# Patient Record
Sex: Female | Born: 1950 | Race: White | Hispanic: No | Marital: Married | Smoking: Never smoker
Health system: Southern US, Community
[De-identification: ages and names within clinical notes are randomized; demographics above are authoritative.]

## PROBLEM LIST (undated history)

## (undated) DIAGNOSIS — I10 Essential (primary) hypertension: Secondary | ICD-10-CM

## (undated) DIAGNOSIS — G43909 Migraine, unspecified, not intractable, without status migrainosus: Secondary | ICD-10-CM

## (undated) HISTORY — PX: HERNIA REPAIR: SHX51

## (undated) HISTORY — PX: OTHER SURGICAL HISTORY: SHX169

## (undated) HISTORY — PX: TOTAL ABDOMINAL HYSTERECTOMY: SHX209

---

## 1998-06-16 ENCOUNTER — Ambulatory Visit (HOSPITAL_BASED_OUTPATIENT_CLINIC_OR_DEPARTMENT_OTHER): Admission: RE | Admit: 1998-06-16 | Discharge: 1998-06-16 | Payer: Self-pay | Admitting: Orthopedic Surgery

## 2000-03-20 ENCOUNTER — Encounter: Payer: Self-pay | Admitting: Orthopedic Surgery

## 2001-08-25 ENCOUNTER — Encounter: Payer: Self-pay | Admitting: Orthopedic Surgery

## 2001-08-25 ENCOUNTER — Ambulatory Visit (HOSPITAL_COMMUNITY): Admission: RE | Admit: 2001-08-25 | Discharge: 2001-08-25 | Payer: Self-pay | Admitting: Orthopedic Surgery

## 2005-06-23 ENCOUNTER — Other Ambulatory Visit: Admission: RE | Admit: 2005-06-23 | Discharge: 2005-06-23 | Payer: Self-pay | Admitting: Dermatology

## 2006-09-28 ENCOUNTER — Ambulatory Visit: Payer: Self-pay | Admitting: Orthopedic Surgery

## 2006-11-15 ENCOUNTER — Ambulatory Visit: Payer: Self-pay | Admitting: Orthopedic Surgery

## 2006-12-18 ENCOUNTER — Ambulatory Visit: Payer: Self-pay | Admitting: Orthopedic Surgery

## 2007-04-12 ENCOUNTER — Ambulatory Visit: Payer: Self-pay | Admitting: Orthopedic Surgery

## 2007-04-16 ENCOUNTER — Ambulatory Visit: Payer: Self-pay | Admitting: Orthopedic Surgery

## 2007-04-18 ENCOUNTER — Encounter: Payer: Self-pay | Admitting: Orthopedic Surgery

## 2007-10-04 ENCOUNTER — Ambulatory Visit: Payer: Self-pay | Admitting: Orthopedic Surgery

## 2007-10-04 DIAGNOSIS — M25569 Pain in unspecified knee: Secondary | ICD-10-CM

## 2007-10-04 DIAGNOSIS — M1710 Unilateral primary osteoarthritis, unspecified knee: Secondary | ICD-10-CM

## 2007-11-15 HISTORY — PX: OTHER SURGICAL HISTORY: SHX169

## 2007-12-13 ENCOUNTER — Ambulatory Visit: Payer: Self-pay | Admitting: Orthopedic Surgery

## 2008-01-17 ENCOUNTER — Ambulatory Visit: Payer: Self-pay | Admitting: Orthopedic Surgery

## 2008-02-05 ENCOUNTER — Ambulatory Visit: Payer: Self-pay | Admitting: Orthopedic Surgery

## 2008-02-05 ENCOUNTER — Encounter: Payer: Self-pay | Admitting: Orthopedic Surgery

## 2008-02-05 ENCOUNTER — Inpatient Hospital Stay (HOSPITAL_COMMUNITY): Admission: RE | Admit: 2008-02-05 | Discharge: 2008-02-08 | Payer: Self-pay | Admitting: Orthopedic Surgery

## 2008-02-08 ENCOUNTER — Encounter: Payer: Self-pay | Admitting: Orthopedic Surgery

## 2008-02-12 ENCOUNTER — Encounter: Payer: Self-pay | Admitting: Orthopedic Surgery

## 2008-02-14 ENCOUNTER — Ambulatory Visit: Payer: Self-pay | Admitting: Orthopedic Surgery

## 2008-02-14 DIAGNOSIS — Z96659 Presence of unspecified artificial knee joint: Secondary | ICD-10-CM | POA: Insufficient documentation

## 2008-02-19 ENCOUNTER — Encounter (INDEPENDENT_AMBULATORY_CARE_PROVIDER_SITE_OTHER): Payer: Self-pay | Admitting: Family Medicine

## 2008-02-20 ENCOUNTER — Telehealth: Payer: Self-pay | Admitting: Orthopedic Surgery

## 2008-02-25 ENCOUNTER — Encounter: Payer: Self-pay | Admitting: Orthopedic Surgery

## 2008-02-26 ENCOUNTER — Encounter: Payer: Self-pay | Admitting: Orthopedic Surgery

## 2008-03-04 ENCOUNTER — Telehealth: Payer: Self-pay | Admitting: Orthopedic Surgery

## 2008-03-14 ENCOUNTER — Encounter: Payer: Self-pay | Admitting: Orthopedic Surgery

## 2008-03-17 ENCOUNTER — Ambulatory Visit: Payer: Self-pay | Admitting: Orthopedic Surgery

## 2008-03-24 ENCOUNTER — Encounter: Payer: Self-pay | Admitting: Orthopedic Surgery

## 2008-03-27 ENCOUNTER — Encounter: Payer: Self-pay | Admitting: Orthopedic Surgery

## 2008-04-04 ENCOUNTER — Encounter: Payer: Self-pay | Admitting: Orthopedic Surgery

## 2008-04-10 ENCOUNTER — Telehealth: Payer: Self-pay | Admitting: Orthopedic Surgery

## 2008-04-21 ENCOUNTER — Ambulatory Visit: Payer: Self-pay | Admitting: Orthopedic Surgery

## 2008-07-17 ENCOUNTER — Telehealth: Payer: Self-pay | Admitting: Orthopedic Surgery

## 2008-07-23 ENCOUNTER — Ambulatory Visit: Payer: Self-pay | Admitting: Orthopedic Surgery

## 2008-11-19 ENCOUNTER — Ambulatory Visit: Payer: Self-pay | Admitting: Orthopedic Surgery

## 2008-11-20 ENCOUNTER — Telehealth: Payer: Self-pay | Admitting: Orthopedic Surgery

## 2009-02-18 ENCOUNTER — Ambulatory Visit (HOSPITAL_COMMUNITY): Admission: RE | Admit: 2009-02-18 | Discharge: 2009-02-18 | Payer: Self-pay | Admitting: Orthopedic Surgery

## 2009-02-18 ENCOUNTER — Ambulatory Visit: Payer: Self-pay | Admitting: Orthopedic Surgery

## 2010-02-22 ENCOUNTER — Ambulatory Visit: Payer: Self-pay | Admitting: Orthopedic Surgery

## 2010-07-06 ENCOUNTER — Encounter: Payer: Self-pay | Admitting: Orthopedic Surgery

## 2010-07-07 ENCOUNTER — Ambulatory Visit: Payer: Self-pay | Admitting: Orthopedic Surgery

## 2010-09-08 ENCOUNTER — Ambulatory Visit: Payer: Self-pay | Admitting: Orthopedic Surgery

## 2010-11-23 ENCOUNTER — Ambulatory Visit
Admission: RE | Admit: 2010-11-23 | Discharge: 2010-11-23 | Payer: Self-pay | Source: Home / Self Care | Attending: Orthopedic Surgery | Admitting: Orthopedic Surgery

## 2010-11-23 ENCOUNTER — Encounter: Payer: Self-pay | Admitting: Orthopedic Surgery

## 2010-11-24 ENCOUNTER — Encounter (INDEPENDENT_AMBULATORY_CARE_PROVIDER_SITE_OTHER): Payer: Self-pay | Admitting: *Deleted

## 2010-11-25 ENCOUNTER — Encounter: Payer: Self-pay | Admitting: Orthopedic Surgery

## 2010-12-09 ENCOUNTER — Telehealth: Payer: Self-pay | Admitting: Orthopedic Surgery

## 2010-12-16 NOTE — Letter (Signed)
Summary: surgery order planned surg date 01/03/11  surgery order planned surg date 01/03/11   Imported By: Cammie Sickle 11/26/2010 17:13:21  _____________________________________________________________________  External Attachment:    Type:   Image     Comment:   External Document

## 2010-12-16 NOTE — Assessment & Plan Note (Signed)
Summary: 2 M RE-CK KNEE/RESP TO MED/W.COMP/CAF   Visit Type:  Follow-up  CC:  left knee pain.  History of Present Illness: I saw [name] in the office today for a 2 month followup visit.  She is a 60 years old woman with the complaint of: continued LEFT knee pain status post microfracture many many years ago which involved medial femoral condyle.  She did well up until the last year or so  I gave her an injection last time and helped maybe a little bit she still complains of medial knee pain swelling and a pulling sensation behind the knee.  At this point she is considering surgical treatment with knee replacement but at this time she would like to wait a little bit longer.  Medications potassium, Dyazide, anticholesterol medicine.    Allergies: No Known Drug Allergies   Impression & Recommendations:  Problem # 1:  KNEE, ARTHRITIS, DEGEN./OSTEO (ICD-715.96) Assessment Unchanged  Orders: Est. Patient Level II (16109)  Patient Instructions: 1)  she is going to see [place] sometime at the beginning of next year 2012 to discuss this further.  There are workers compensation issues with this surgery and need to be addressed   Orders Added: 1)  Est. Patient Level II [60454]

## 2010-12-16 NOTE — Progress Notes (Signed)
Summary: Progress note  Progress note   Imported By: Jacklynn Ganong 07/23/2010 10:16:38  _____________________________________________________________________  External Attachment:    Type:   Image     Comment:   External Document

## 2010-12-16 NOTE — Progress Notes (Addendum)
Summary: government may not allow her surg / refer to clin update 11/24/10  Phone Note Call from Patient   Summary of Call: [name] called today to say that she received a letter from the government saying they will not allow her to have the knee surgery.  Will bring a copy of the letter for our files Initial call taken by: Jacklynn Ganong,  December 09, 2010 2:58 PM

## 2010-12-16 NOTE — Letter (Signed)
Summary: Auth Request Fax Workers comp for in-pt surgery  Auth Request Fax Workers comp for in-pt surgery   Imported By: Cammie Sickle 11/26/2010 17:10:07  _____________________________________________________________________  External Attachment:    Type:   Image     Comment:   External Document

## 2010-12-16 NOTE — Assessment & Plan Note (Signed)
Summary: XR RT TKA/BCBS/BSF   Visit Type:  Follow-up  CC:  right knee replacement.  History of Present Illness: I saw [name] in the office today for a followup visit.  She is a 60 years old woman with the complaint of:  right knee  Xrays today.  Right knee replacement 02/05/08.  2 years post op   The  alignment was normal, PTF reduced without tilt or subluxation, no evidence of loosening.  IMPRESSION: normal appearance of the implant   DEPUY FIXED BEARING SIGMA   Normal appearance of the right knee; No swelling; 115 ROM Stable AP and Coronal EXT power is normal  Gait is normal       Allergies: No Known Drug Allergies   Impression & Recommendations:  Problem # 1:  TOTAL KNEE FOLLOW-UP (ICD-V43.65) Assessment Improved  Orders: Est. Patient Level III (19147) Knee x-ray,  3 views (82956)  Patient Instructions: 1)  Please schedule a follow-up appointment in 1 year. 2)  TKA XRAYS RIGHT KNEE

## 2010-12-16 NOTE — Letter (Signed)
Summary: Dr Romeo Apple physician report 03/20/00  Dr Romeo Apple physician report 03/20/00   Imported By: Cammie Sickle 11/26/2010 17:09:11  _____________________________________________________________________  External Attachment:    Type:   Image     Comment:   External Document

## 2010-12-16 NOTE — Assessment & Plan Note (Signed)
Summary: left knee pain/needs xrays/work comp.cbt   Visit Type:  Follow-up  CC:  left knee pain.  History of Present Illness: a 60 year old female status post RIGHT knee replacement several years ago presents withhistory of LEFT knee pain.  She had arthroscopy LEFT knee microfracture with drilling about 6 years ago comes in now complaining of posterolateral leg pain as well as pain over the medial compartment of the LEFT knee.  Pain seemed to come on insidiously is described as dull and deep with moderate severity and is intermittent it appears to be related to weightbearing activities.  There appears to be no swelling.  No catching no locking.  ALLERGIES no drug ALLERGIES  Medications potassium, Dyazide, anticholesterol medicine.  Review of systems as stated she has some posterolateral leg pain, denies back pain or radiculopathy.  Exam general exam is normal psych exam is normal neuro exam is normal vascular exam is normal  Musculoskeletal findings included a normal gait pattern for total knee replacement no limp on the LEFT side.  She does have medial joint line tenderness.  Range of motion is zero-120  Motor exam in the quads 5 over 5 strength.  The knee is stable.  She does have a positive straight leg raise with pain behind the leg.  X-rays were obtained 3 views of the knee shows that she has medial and patellofemoral joint space compromise described as moderate.  Impression osteoarthritis 719.46 plan injection, medication and hematoma 5 mg b.i.d. follow up in 2 monthsis  Allergies: No Known Drug Allergies  Past History:  Past Medical History: Last updated: 11/19/2008 Fluid Decreased potassium cholesterol  Past Surgical History: Last updated: 11/19/2008 Knee Arthroscopy both knees Total Abdominal Hysterectomy Hernia rt knee total arthroplasty, 2009  Physical Exam  Extremities:  RIGHT knee midline incision total knee skin normal.  No swelling.  Range of motion  125.  Motor exam 5 over 5.  Knee stable.   Impression & Recommendations:  Problem # 1:  KNEE, ARTHRITIS, DEGEN./OSTEO (ICD-715.96) Assessment New  intra-articular injection LEFT knee Verbal consent was obtained. The knee was prepped with alcohol and ethyl chloride. 1 cc of depomedrol 40mg /cc and 4 cc of lidocaine 1% was injected. there were no complications.  Orders: Est. Patient Level IV (16109) Knee x-ray,  3 views (60454) Joint Aspirate / Injection, Large (20610) Depo- Medrol 40mg  (J1030)  Patient Instructions: 1)  Please schedule a follow-up appointment in 2 months.

## 2010-12-16 NOTE — Assessment & Plan Note (Signed)
Summary: RE-EVAL LT KNEE/?NEW XRAY/W.COMP/CAF   Visit Type:  Follow-up  CC:  recheck left knee.  History of Present Illness: I saw [name] in the office today for a followup visit.  She is a 60 years old woman with the complaint of: continued LEFT knee pain status post microfracture many many years ago which involved medial femoral condyle.  Today is recheck on the left knee.  Xrays taken 07/07/10 left knee.  Medications: potassium, Dyazide, anticholesterol medicine, no pain meds.  as noted above, but she has come to the end of the road. Regarding her LEFT knee as it is causing her significant problems in terms her activities of daily living, as well as this severe pain that she is experiencing. Her pain will reach the level of an 8/10 but she is able to continue on, however, her activities of daily living have become progressively more difficult.  She has had a RIGHT knee replacement and understands risk and benefits of knee replacement surgery.  We completed our informed consent process today.  We scheduled her for surgery pending worker's compensation approval. She will also need to include physical therapy after surgery at home, as well as at the hospital.  Please see separate history and physical dictation      Allergies (verified): No Known Drug Allergies   Impression & Recommendations:  Problem # 1:  KNEE, ARTHRITIS, DEGEN./OSTEO (ICD-715.96) Assessment Deteriorated the most recent film. It took shows that she has varus medial and patellofemoral arthritis. Orders: Est. Patient Level IV (46962)  Patient Instructions: 1)  DOS 01/03/11 2)  I will call you and let you know when to go for your preop at [facility], take packet with you. 3)  Post op 1 in our office on 01/17/11   Orders Added: 1)  Est. Patient Level IV [95284]

## 2010-12-16 NOTE — Medication Information (Signed)
Summary: Tax adviser   Imported By: Cammie Sickle 07/12/2010 10:43:54  _____________________________________________________________________  External Attachment:    Type:   Image     Comment:   External Document

## 2010-12-20 ENCOUNTER — Encounter: Payer: Self-pay | Admitting: Orthopedic Surgery

## 2010-12-21 ENCOUNTER — Encounter: Payer: Self-pay | Admitting: Orthopedic Surgery

## 2010-12-22 ENCOUNTER — Encounter (INDEPENDENT_AMBULATORY_CARE_PROVIDER_SITE_OTHER): Payer: Self-pay | Admitting: *Deleted

## 2010-12-28 ENCOUNTER — Telehealth: Payer: Self-pay | Admitting: Orthopedic Surgery

## 2010-12-28 ENCOUNTER — Telehealth (INDEPENDENT_AMBULATORY_CARE_PROVIDER_SITE_OTHER): Payer: Self-pay | Admitting: *Deleted

## 2010-12-29 ENCOUNTER — Other Ambulatory Visit (HOSPITAL_COMMUNITY): Payer: Self-pay

## 2010-12-30 ENCOUNTER — Telehealth: Payer: Self-pay | Admitting: Orthopedic Surgery

## 2010-12-30 NOTE — Letter (Signed)
Summary: Re-Fax Auth request W Comp OWCP  Re-Fax Auth request W Comp OWCP   Imported By: Cammie Sickle 12/20/2010 20:04:53  _____________________________________________________________________  External Attachment:    Type:   Image     Comment:   External Document

## 2010-12-30 NOTE — Miscellaneous (Signed)
Summary: faxed notes to med mod and [employer]iva for surgery   Clinical Lists Changes

## 2010-12-30 NOTE — Letter (Signed)
Summary: Workers Biomedical scientist of fax rec'd  Workers comp acknowledgement of fax rec'd   Imported By: Cammie Sickle 12/21/2010 12:15:16  _____________________________________________________________________  External Attachment:    Type:   Image     Comment:   External Document

## 2011-01-03 ENCOUNTER — Ambulatory Visit (HOSPITAL_COMMUNITY)
Admission: RE | Admit: 2011-01-03 | Payer: Worker's Compensation | Source: Ambulatory Visit | Admitting: Orthopedic Surgery

## 2011-01-05 NOTE — Progress Notes (Addendum)
Summary: Patient cancelling surgery until further notice from [facility]  Phone Note Call from Patient   Caller: Patient Summary of Call: Patient called back, due to no receipt of authorization from [facility] comp, she is cancelling her total knee surgery for Mon, 01/03/11, as she wishes to give the courtesy of advanced notice to [facility] [employer]. and to all other related contacts for the surgery.  She will re-schedule as soon as possible, upon Workers Comp's approval. *   Patient asking if Dr Romeo Apple will consider ordering an Xray at either [facility] or [facility] since it is her understanding from [facility] claims examiner that this is what Workers Comp requests.  The notes and report of previous Xray, performed in our office in August of 2011, had been sent and is under review. Dr Romeo Apple please advise. Initial call taken by: Cammie Sickle,  December 30, 2010 1:46 PM  Follow-up for Phone Call        ok have reneee or chasity do this friday  Follow-up by: Fuller Canada MD,  December 30, 2010 4:16 PM  Additional Follow-up for Phone Call Additional follow up Details #1::        cxed surgery with kim and called Depuy rep [facility] Additional Follow-up by: Ether Griffins,  December 30, 2010 4:32 PM

## 2011-01-05 NOTE — Progress Notes (Signed)
Summary: Pt states Workers Comp needs Lawyer Note Call from Patient   Caller: Patient Summary of Call: Patient called back today, following my fol/up call to her and to Workers Comp carrier re: status of authorization request for total knee surgery on 01/03/11.  Patient states that claims examiner requests Xray report from 07/07/10 office visit, signed by radiologist.  The Xray was done here and the appropriate office notes were sent to Workers comp, electronically signed by Dr Romeo Apple.  Dr Romeo Apple please advise. Initial call taken by: Cammie Sickle,  December 28, 2010 10:03 AM  Follow-up for Phone Call        Informed him that there are no radiology notes or readings of private office. X-rays Follow-up by: Fuller Canada MD,  December 28, 2010 10:09 AM  Additional Follow-up for Phone Call Additional follow up Details #1::        I have advised patient and have called [employer] Workers Comp at Ph # [phone] in response to patient's conversation with claims examiner,Katherine Pollie Meyer.  Lft voice mail message.  Notified patient of status. Additional Follow-up by: Cammie Sickle,  December 28, 2010 3:47 PM

## 2011-01-05 NOTE — Progress Notes (Signed)
Summary: Please cancel pre-op appt of 12/29/10,W.Comp still pending  Phone Note Call from Patient   Caller: Patient Summary of Call: Patient called back and per previous notes, does not yet have approval from Workers Comp for her total knee surgery. Asking to cancel her pre-op appt at this time, and if no further word over the next 2 to 3 days, she will have [place] cancel the surgery date also, pending Worker's comp's further review.   *Please note: she gave her Cell ph # [phone] / home ph (same as above, although going out of town to her mother's tomorrow, 12/29/10) Initial call taken by: Cammie Sickle,  December 28, 2010 2:48 PM  Follow-up for Phone Call        ok canceled preop Follow-up by: Ether Griffins,  December 28, 2010 2:59 PM  Additional Follow-up for Phone Call Additional follow up Details #1::        advised patient. Additional Follow-up by: Cammie Sickle,  December 28, 2010 3:51 PM

## 2011-01-06 ENCOUNTER — Ambulatory Visit (HOSPITAL_COMMUNITY)
Admission: RE | Admit: 2011-01-06 | Discharge: 2011-01-06 | Disposition: A | Discharge status: Home / Self Care | Source: Ambulatory Visit | Attending: Orthopedic Surgery | Admitting: Orthopedic Surgery

## 2011-01-06 ENCOUNTER — Encounter: Payer: Self-pay | Admitting: Orthopedic Surgery

## 2011-01-06 ENCOUNTER — Other Ambulatory Visit: Payer: Self-pay | Admitting: Orthopedic Surgery

## 2011-01-06 ENCOUNTER — Telehealth (INDEPENDENT_AMBULATORY_CARE_PROVIDER_SITE_OTHER): Payer: Self-pay | Admitting: *Deleted

## 2011-01-06 DIAGNOSIS — M25569 Pain in unspecified knee: Secondary | ICD-10-CM | POA: Insufficient documentation

## 2011-01-11 NOTE — Progress Notes (Signed)
Summary: new Xray order per Workers Comp request  Phone Note Outgoing Call   Call placed to: Patient Summary of Call: Call to patient in response to Workers Comp request for new Xray LT knee w/radiologist reading. Order for [facility] being faxed today; patient notified and will go today for the Xray.  - After Dr Romeo Apple receives report, I will fax to Workers Comp. Initial call taken by: Cammie Sickle,  January 06, 2011 9:24 AM  Follow-up for Phone Call        ok Follow-up by: Ether Griffins,  January 06, 2011 9:37 AM

## 2011-01-17 ENCOUNTER — Ambulatory Visit: Payer: Self-pay | Admitting: Orthopedic Surgery

## 2011-01-20 NOTE — Letter (Signed)
Summary: Xray order LT knee [facility]  Xray order LT knee [facility]   Imported By: Cammie Sickle 01/11/2011 19:08:01  _____________________________________________________________________  External Attachment:    Type:   Image     Comment:   External Document

## 2011-01-25 ENCOUNTER — Encounter: Payer: Self-pay | Admitting: Orthopedic Surgery
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
## 2011-01-26 ENCOUNTER — Encounter (INDEPENDENT_AMBULATORY_CARE_PROVIDER_SITE_OTHER): Payer: Self-pay | Admitting: *Deleted

## 2011-01-27 ENCOUNTER — Ambulatory Visit (INDEPENDENT_AMBULATORY_CARE_PROVIDER_SITE_OTHER): Payer: Worker's Compensation | Admitting: Orthopedic Surgery

## 2011-01-27 ENCOUNTER — Encounter: Payer: Self-pay | Admitting: Orthopedic Surgery

## 2011-01-27 DIAGNOSIS — M1710 Unilateral primary osteoarthritis, unspecified knee: Secondary | ICD-10-CM

## 2011-02-01 ENCOUNTER — Encounter: Payer: Self-pay | Admitting: Orthopedic Surgery

## 2011-02-01 NOTE — Letter (Signed)
Summary: Approval OWCP surgery prior authorization   Approval OWCP surgery prior authorization   Imported By: Cammie Sickle 01/27/2011 20:06:11  _____________________________________________________________________  External Attachment:    Type:   Image     Comment:   External Document

## 2011-02-01 NOTE — Miscellaneous (Signed)
Summary: Surgery authorized per [facility] Workers Comp 01/25/11  Clinical Lists Changes  On 01/25/11 - Ph [phone] - Fol/up call to insurer, following patient receiving call that total knee surgery is approved.   Per [employer], [place] [address] LT total knee arthroplasty has been approved. AUTH # [id number].  Spoke w/Monique, ref# [id number]. 01/25/11 - Confirmation letter received by fax; scanned into chart.  01/26/11 - Called patient to notify.  Patient is coming 01/27/11 for appointment re: scheduling of surgery.

## 2011-02-01 NOTE — Assessment & Plan Note (Signed)
Summary: SCHEDULE SURGERY/XRAY [facility]/W.Comp insurance approval   Visit Type:  Follow-up  CC:  f knee replacement surgery.  History of Present Illness:   60 year old female with end-stage osteoarthritis LEFT knee.  She failed conservative management.  She complains of severe pain.  We'll like to have the surgery today if she could.  Her pain is primarily medial, she has trouble with activities of daily living.  She describes her pain as constant waxing and waning in its intensity, she describes as severe, sharp and dull throbbing worse with exercise and not improving.  Allergies: No Known Drug Allergies  Past History:  Past Medical History: Last updated: 11/19/2008 Fluid Decreased potassium cholesterol  Past Surgical History: Last updated: 11/19/2008 Knee Arthroscopy both knees Total Abdominal Hysterectomy Hernia rt knee total arthroplasty, 2009  Family History: Reviewed history and no changes required. no history of any serious medical illnesses  Social History: Patient is married.  denies smoking or drinking  Review of Systems Musculoskeletal:  See HPI.  The review of systems is negative for Constitutional, Cardiovascular, Respiratory, Gastrointestinal, Genitourinary, Neurologic, Endocrine, Psychiatric, Skin, HEENT, Immunology, and Hemoatologic.  Physical Exam  Additional Exam:  vital signs have been stable  She is well-developed and well-nourished grooming and hygiene are normal there are no gross deformities  The cardiovascular system reveals no swelling or varicose veins, pulses and temperature are normal, there is no edema or tenderness  Skin is warm dry and intact with no lesions  Neuro exam shows normal sensation coordination reflux mood and affect are normal  Lymphatic systems benign nodes in the cervical spine  Musculoskeletal she has normal strength range of motion stability and alignment in the upper extremities   Her RIGHT total knee is  functioning well with 115 of flexion and full extension  The knee remained stable there is no tenderness swelling or alignment issues  The LEFT knee is tender over the medial joint line shows a flexion arc of 125 normally today only 100 grade 5 motor strength.  No skin issues  X-rays show medial compartment joint space narrowing consistent with osteoarthritis and mild varus deformity  Plan LEFT total knee arthroplasty  Informed consent has been done in the office    Impression & Recommendations:  Problem # 1:  KNEE, ARTHRITIS, DEGEN./OSTEO ([id number]-638.75)  Orders: Est. Patient Level III (64332) LEFT TKA    Orders Added: 1)  Est. Patient Level III [95188]

## 2011-02-01 NOTE — Miscellaneous (Signed)
  preop for left tka Clinical Lists Changes

## 2011-02-10 NOTE — Letter (Signed)
Summary: Auth request Physical therapy [employer] Work Comp  Auth request Physical therapy [employer] Work Comp   Imported By: Cammie Sickle 02/04/2011 09:46:42  _____________________________________________________________________  External Attachment:    Type:   Image     Comment:   External Document

## 2011-02-10 NOTE — Letter (Signed)
Summary: Fax response physical therapy auth request W.Comp [employer]CP  Fax response physical therapy auth request W.Comp [employer]CP   Imported By: Cammie Sickle 02/04/2011 09:10:41  _____________________________________________________________________  External Attachment:    Type:   Image     Comment:   External Document

## 2011-02-24 ENCOUNTER — Telehealth: Payer: Self-pay | Admitting: *Deleted

## 2011-02-24 NOTE — Telephone Encounter (Signed)
I faxed information to [facility] and [employer] for assistance after total knee replacement

## 2011-03-03 ENCOUNTER — Other Ambulatory Visit: Payer: Self-pay | Admitting: Orthopedic Surgery

## 2011-03-03 ENCOUNTER — Encounter (HOSPITAL_COMMUNITY)
Admission: RE | Admit: 2011-03-03 | Discharge: 2011-03-03 | Source: Ambulatory Visit | Attending: Orthopedic Surgery | Admitting: Orthopedic Surgery

## 2011-03-03 ENCOUNTER — Telehealth: Payer: Self-pay | Admitting: Orthopedic Surgery

## 2011-03-03 LAB — PROTIME-INR
INR: 0.9 (ref 0.00–1.49)
Prothrombin Time: 12.4 seconds (ref 11.6–15.2)

## 2011-03-03 LAB — DIFFERENTIAL
Basophils Absolute: 0 10*3/uL (ref 0.0–0.1)
Basophils Relative: 0 % (ref 0–1)
Eosinophils Relative: 2 % (ref 0–5)
Lymphocytes Relative: 22 % (ref 12–46)
Monocytes Absolute: 0.4 10*3/uL (ref 0.1–1.0)
Neutro Abs: 4.9 10*3/uL (ref 1.7–7.7)

## 2011-03-03 LAB — CBC
MCHC: 34.5 g/dL (ref 30.0–36.0)
Platelets: 228 10*3/uL (ref 150–400)
RDW: 12.8 % (ref 11.5–15.5)
WBC: 7 10*3/uL (ref 4.0–10.5)

## 2011-03-03 LAB — BASIC METABOLIC PANEL
BUN: 11 mg/dL (ref 6–23)
Chloride: 101 mEq/L (ref 96–112)
Creatinine, Ser: 0.71 mg/dL (ref 0.4–1.2)
GFR calc Af Amer: >60 mL/min (ref >60)
GFR calc non Af Amer: >60 mL/min (ref >60)
Potassium: 3.9 mEq/L (ref 3.5–5.1)

## 2011-03-03 LAB — SURGICAL PCR SCREEN: MRSA, PCR: NEGATIVE

## 2011-03-03 NOTE — Telephone Encounter (Signed)
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  Any questions, Gina's # is [phone]

## 2011-03-06 ENCOUNTER — Encounter: Payer: Self-pay | Admitting: Orthopedic Surgery

## 2011-03-06 NOTE — H&P (Signed)
Summary: [name]/W.Comp insurance approval   March 05, 2011   Visit Type: Follow-up  CC: f knee replacement surgery.  History of Present Illness:  60 year old female with end-stage osteoarthritis LEFT knee. She failed conservative management. She complains of severe pain. We'll like to have the surgery today if she could.  Her pain is primarily medial, she has trouble with activities of daily living.  She describes her pain as constant waxing and waning in its intensity, she describes as severe, sharp and dull throbbing worse with exercise and not improving.  Allergies:  No Known Drug Allergies  Past History:  Past Medical History:  Last updated: 11/19/2008  Fluid  Decreased potassium  cholesterol  Past Surgical History:  Last updated: 11/19/2008  Knee Arthroscopy both knees  Total Abdominal Hysterectomy  Hernia  rt knee total arthroplasty, 2009  Family History:  Reviewed history and no changes required.  no history of any serious medical illnesses  Social History:  Patient is married.  denies smoking or drinking  Review of Systems  Musculoskeletal: See HPI.  The review of systems is negative for Constitutional, Cardiovascular, Respiratory, Gastrointestinal, Genitourinary, Neurologic, Endocrine, Psychiatric, Skin, HEENT, Immunology, and Hemoatologic.  Physical Exam  Additional Exam: vital signs have been stable  She is well-developed and well-nourished grooming and hygiene are normal there are no gross deformities  The cardiovascular system reveals no swelling or varicose veins, pulses and temperature are normal, there is no edema or tenderness  Skin is warm dry and intact with no lesions  Neuro exam shows normal sensation coordination reflux mood and affect are normal  Lymphatic systems benign nodes in the cervical spine  Musculoskeletal she has normal strength range of motion stability and alignment in the upper extremities  Her RIGHT total knee is  functioning well with 115 of flexion and full extension  The knee remained stable there is no tenderness swelling or alignment issues  The LEFT knee is tender over the medial joint line shows a flexion arc of 125 normally today only 100 grade 5 motor strength. No skin issues  X-rays show medial compartment joint space narrowing consistent with osteoarthritis and mild varus deformity  Plan LEFT total knee arthroplasty  Informed consent has been done in the office  Impression & Recommendations:  Problem # 1: KNEE, ARTHRITIS, DEGEN./OSTEO (YHC-623.76)  Orders:  Est. Patient Level III (28315)  LEFT TKA  Orders Added:  1) Est. Patient Level III [17616]

## 2011-03-07 ENCOUNTER — Ambulatory Visit (HOSPITAL_COMMUNITY)

## 2011-03-07 ENCOUNTER — Inpatient Hospital Stay (HOSPITAL_COMMUNITY)
Admission: RE | Admit: 2011-03-07 | Discharge: 2011-03-09 | DRG: 470 | Disposition: A | Discharge status: Home Health | Source: Ambulatory Visit | Attending: Orthopedic Surgery | Admitting: Orthopedic Surgery

## 2011-03-07 DIAGNOSIS — M1710 Unilateral primary osteoarthritis, unspecified knee: Principal | ICD-10-CM | POA: Diagnosis present

## 2011-03-07 DIAGNOSIS — IMO0002 Reserved for concepts with insufficient information to code with codable children: Secondary | ICD-10-CM

## 2011-03-08 LAB — CBC
HCT: 32 % — ABNORMAL LOW (ref 36.0–46.0)
MCH: 29.4 pg (ref 26.0–34.0)
MCHC: 35 g/dL (ref 30.0–36.0)
MCV: 84 fL (ref 78.0–100.0)
Platelets: 175 10*3/uL (ref 150–400)
RDW: 12.5 % (ref 11.5–15.5)

## 2011-03-08 LAB — BASIC METABOLIC PANEL
BUN: 8 mg/dL (ref 6–23)
Calcium: 8.6 mg/dL (ref 8.4–10.5)
Creatinine, Ser: 0.78 mg/dL (ref 0.4–1.2)
GFR calc non Af Amer: >60 mL/min (ref >60)
Glucose, Bld: 118 mg/dL — ABNORMAL HIGH (ref 70–99)

## 2011-03-08 LAB — DIFFERENTIAL
Eosinophils Absolute: 0 10*3/uL (ref 0.0–0.7)
Eosinophils Relative: 1 % (ref 0–5)
Lymphocytes Relative: 13 % (ref 12–46)
Lymphs Abs: 1.1 10*3/uL (ref 0.7–4.0)
Monocytes Absolute: 0.7 10*3/uL (ref 0.1–1.0)

## 2011-03-08 NOTE — Op Note (Signed)
[name], [name]                   ACCOUNT NO.:  [id number]  MEDICAL RECORD NO.:  [id number]           PATIENT TYPE:  I  LOCATION:  A339                          FACILITY:  [facility]  PHYSICIAN:  Vickki Hearing, M.D.DATE OF BIRTH:  [birth year]  DATE OF PROCEDURE: DATE OF DISCHARGE:                              OPERATIVE REPORT   SURGEON:  Vickki Hearing, MD  A 60 year old female, end-stage osteoarthritis, left knee, failed conservative management, status post previous microfracture, presented for knee replacement on the left.  PREOPERATIVE DIAGNOSIS:  Osteoarthritis, left knee.  POSTOPERATIVE DIAGNOSIS:  Osteoarthritis, left knee.  PROCEDURE:  Left total knee arthroplasty.  IMPLANTS:  DePuy fixed bearing sigma, size 3 femur, size 2.5 tibia, size 10 polyethylene PS insert and the size 32 patella, assisted by Margaree Mackintosh and Lucrezia Europe.  OPERATIVE FINDINGS:  Severe arthritis of the medial compartment with grade 4 changes, grade 3 changes of the patella, mild varus deformity.  TOURNIQUET TIME:  1 hour and 20 minutes at 300 mmHg.  COMPLICATIONS:  None.  The patient went to PACU in good condition.  Spinal anesthetic.  The patient was identified in the preop holding area and the left knee was marked by the patient and surgeon and the chart was updated and the site was confirmed.  The patient was taken to the operating room for spinal anesthetic, after which, 1 gram of Ancef was administered.  The Foley catheter was then placed in sterile fashion and a tourniquet was placed on the proximal portion of the left thigh.  The leg was then prepped and draped using sterile technique and the time-out procedure was executed.  The limb was exsanguinated with a 6-inch Esmarch and placed in flexion and the tourniquet was elevated.  A midline incision was made. Subcutaneous tissue was divided and medial arthrotomy was performed and the patella was everted.  The ACL and PCL were  resected as well as the anterior horns of the lateral and medial meniscus.  A portion of the patella fat pad was also resected.  After elevation of the medial soft tissue sleeve, a drill was placed in the center of the femoral canal and the canal was decompressed with irrigation and suction.  The intramedullary guide was set for a 10-mm resection and the 5 degrees valgus cut.  This instrument was pinned to the distal femur and an oscillating saw was removed, we used to remove the distal femur.  A figure-of-eight distal femoral configuration was noted and the femoral sizer was placed.  The femur was sized to a size 3 and pinned in external rotation to match the epicondyles.  After these bone fragments were removed with a four-in-one cutting block, we turned our attention to the tibia.  The tibial resection was set for an anatomic slope and a neutral varus valgus cut.  We referenced the lateral higher side 8-mm resection.  Once the block was pinned in place, the resection was made and the tibial single was checked for larger resection lateral than medial.  Spacer blocks were then placed.  A 12.5 spacer block was too tight,  10.5 fit well and balanced the knee in extension.  The flexion gap also matched and we proceeded to make the box cut with a size 3 cutting guide.  We then did a trial reduction and ranged the knee until the tibial tray rotation set itself and we marked the tibia followed by punching.  The patella was then measured to a size 23 and resected down to a 13 and a 32-mm button was placed and the three peg holes were drilled.  A trial reduction was obtained and the patella tracked normally.  The knee was balanced in flexion and extension and the trial components were removed.  The knee was irrigated and the cement was mixed on the back table in standard fashion.  After application of the cement and prosthesis, the cement was allowed to cure.  Excess cement was  removed, repeat irrigation was performed.  A 10-mm insert was placed after trial with the 10-mm insert.  The knee was checked for range of motion, patella tracked normally.  The knee had good flexion and extension and the passive flexion test gave a 115 degrees of knee flexion.  The knee was closed with the knee in flexion with #1 Bralon with two interrupted sutures.  Deep to the closure, 30 mL of Marcaine with epinephrine was injected and then a second injection was done with the capsule closed.  The pain pump catheter was placed in the subcu area and then a layer of 0 Monocryl and 2-0 Monocryl were used to close the skin followed by reapproximation with staples.  A pain pump was activated.  The knee was wrapped from toes to groin over a sterile Mepilex dressing and a Cryocup was placed and activated.  Postoperative plan is for full weightbearing.  The CPM machine will be started tomorrow.  The patient will be on the foot pumps, Ecotrin b.i.d. and TED hose.     Vickki Hearing, M.D.     SEH/MEDQ  D:  03/07/2011  T:  03/07/2011  Job:  [id number]  Electronically Signed by Fuller Canada M.D. on 03/08/2011 08:36:56 AM

## 2011-03-09 LAB — DIFFERENTIAL
Basophils Absolute: 0 10*3/uL (ref 0.0–0.1)
Basophils Relative: 0 % (ref 0–1)
Eosinophils Absolute: 0.2 10*3/uL (ref 0.0–0.7)
Eosinophils Relative: 3 % (ref 0–5)
Lymphs Abs: 1.2 10*3/uL (ref 0.7–4.0)
Neutrophils Relative %: 75 % (ref 43–77)

## 2011-03-09 LAB — CBC
MCV: 87.5 fL (ref 78.0–100.0)
Platelets: 148 10*3/uL — ABNORMAL LOW (ref 150–400)
RDW: 12.8 % (ref 11.5–15.5)
WBC: 8.8 10*3/uL (ref 4.0–10.5)

## 2011-03-09 LAB — BASIC METABOLIC PANEL
BUN: 7 mg/dL (ref 6–23)
GFR calc Af Amer: >60 mL/min (ref >60)
GFR calc non Af Amer: >60 mL/min (ref >60)
Potassium: 3.7 mEq/L (ref 3.5–5.1)

## 2011-03-12 LAB — CROSSMATCH
ABO/RH(D): B POS
Antibody Screen: NEGATIVE
Unit division: 0

## 2011-03-14 ENCOUNTER — Ambulatory Visit (INDEPENDENT_AMBULATORY_CARE_PROVIDER_SITE_OTHER): Payer: Worker's Compensation | Admitting: Orthopedic Surgery

## 2011-03-14 ENCOUNTER — Encounter: Payer: Self-pay | Admitting: Orthopedic Surgery

## 2011-03-14 DIAGNOSIS — Z96659 Presence of unspecified artificial knee joint: Secondary | ICD-10-CM

## 2011-03-14 NOTE — Progress Notes (Signed)
Surgery April 16  This is postop visit #1 unscheduled.  The patient was doing well ambulating with a cane. She balanced on one leg as part of her therapy and felt a pop and then felt distal anterior knee pain. She can no longer emboli with a cane. She had to use a set of crutches.  Exam shows that she is angulating with crutches full weightbearing. She can make a full knee extension and can flex the knee independently. The collateral ligaments are stable. There is tenderness on the medial and lateral portions of the distal part of the incision. There is no increased bruising or swelling. There.  X-ray was obtained. Prosthesis is in good alignment with no evidence of fracture or loosening.  Impression unexplained pain distal anterior knee.  Plan continue physical therapy decrease in her overall activity level in followup next week for suture/staple removal

## 2011-03-15 NOTE — Group Therapy Note (Signed)
  [name], [name]                   ACCOUNT NO.:  [id number]  MEDICAL RECORD NO.:  [id number]           PATIENT TYPE:  I  LOCATION:  A339                          FACILITY:  [facility]  PHYSICIAN:  Vickki Hearing, M.D.DATE OF BIRTH:  03-03-1951  DATE OF PROCEDURE: DATE OF DISCHARGE:                                PROGRESS NOTE   Postop day 1 left total knee arthroplasty, doing well.  Vital signs are stable.  Hemoglobin is 11.2.  Pain level is zero.  Potassium is 3.4. The patient will start physical therapy today.     Vickki Hearing, M.D.     SEH/MEDQ  D:  03/08/2011  T:  03/08/2011  Job:  [id number]  Electronically Signed by Fuller Canada M.D. on 03/15/2011 09:55:31 AM

## 2011-03-15 NOTE — Discharge Summary (Signed)
  [name], [name]                   ACCOUNT NO.:  [id number]  MEDICAL RECORD NO.:  [id number]           PATIENT TYPE:  I  LOCATION:  A339                          FACILITY:  [facility]  PHYSICIAN:  Vickki Hearing, M.D.DATE OF BIRTH:  Sep 21, 1951  DATE OF ADMISSION:  03/07/2011 DATE OF DISCHARGE:  04/25/2012LH                              DISCHARGE SUMMARY   ADMITTING DIAGNOSIS:  Osteoarthritis, left knee.  DISCHARGE DIAGNOSIS:  Osteoarthritis, left knee.  PROCEDURE:  Left total knee arthroplasty.  IMPLANTS:  DePuy fixed bearing sigma size 3 femur, size 2.5 tibia, size 10 polyethylene PS insert, size 32 patella.  OPERATIVE FINDINGS:  Severe arthritis of the medial compartment with grade 3 changes of the patella with mild varus deformity.  There were no complications.  The patient tolerated the procedure well, really had no issues during the hospital course.  Progressed well on physical therapy and was discharged on March 09, 2011, postop day #2.  Her medications were as follows: 1. Simvastatin 40 mg. 2. Citalopram 20 mg. 3. Triamterene/hydrochlorothiazide 37.5/25 mg. 4. Butalbital and acetaminophen with caffeine p.r.n. 5. Potassium 10 mEq t.i.d. 6. Methocarbamol 500 mg q.6 h. P.r.n. 7. Senna/docusate sodium 8.6 mg one daily. 8. Aspirin 325 mg b.i.d. 9. Hydrocodone 5 - 325 mg q.4 h. p.r.n. for pain.  Follow up is scheduled in 2 weeks from surgery on Monday.  The patient is discharged with home health, a CPM machine.  She is to wear TED stockings for 1 month.  She is full weightbearing.  Her overall condition is stable and she is improved.  Her disposition is to home.     Vickki Hearing, M.D.     SEH/MEDQ  D:  03/14/2011  T:  03/15/2011  Job:  [id number]  Electronically Signed by [name] M.D. on 03/15/2011 09:55:28 AM

## 2011-03-21 ENCOUNTER — Ambulatory Visit (INDEPENDENT_AMBULATORY_CARE_PROVIDER_SITE_OTHER): Payer: Worker's Compensation | Admitting: Orthopedic Surgery

## 2011-03-21 ENCOUNTER — Ambulatory Visit: Payer: Self-pay | Admitting: Orthopedic Surgery

## 2011-03-21 DIAGNOSIS — Z96659 Presence of unspecified artificial knee joint: Secondary | ICD-10-CM

## 2011-03-21 MED ORDER — HYDROCODONE-ACETAMINOPHEN 7.5-325 MG PO TABS: 1.0000 | ORAL_TABLET | Freq: Four times a day (QID) | ORAL | Status: AC | PRN

## 2011-03-21 NOTE — Progress Notes (Signed)
Addended by: Fuller Canada on: 03/21/2011 05:26 PM   Modules accepted: Orders

## 2011-03-21 NOTE — Progress Notes (Signed)
April 16 date of surgery  Procedure LEFT total knee arthroplasty  Implants Sigma posterior stabilized total knee with [place]w  Last visit complained of a popping sensation and distal infrapatellar pain which has subsequently resolved with rest and decreased activity  Flexion is now passed 100.  Full extension.  Staples were removed in the office.  The wound looks clean dry and intact with no drainage or cellulitis.  Plan she is ready for outpatient therapy at [facility] Return to clinic in 4 weeks for a third postop visit routine Steri-Strips applied to the wound TED Hose can be removed irritating patient's skin

## 2011-03-24 ENCOUNTER — Telehealth: Payer: Self-pay | Admitting: Orthopedic Surgery

## 2011-03-24 NOTE — Telephone Encounter (Signed)
On 03/23/11 - Called Workers Comp, [place] [employer] "[employer]" ph [phone], requested ext [phone] for Hughie Closs, case manager re: status approval for out-patient physical therapy, for which request was originally faxed 02/01/11 with estimated # of visits, with [facility] noted preferred provider.    Left voice mail message. On 03/24/11, patient relates she has received a call back from Ms. Pollie Meyer, who states that system shows that the request was received. Approval is pending and may be finalized in the next 3 to 5 days.  Patient to call if she receives further response.

## 2011-03-25 ENCOUNTER — Other Ambulatory Visit: Payer: Self-pay | Admitting: Orthopedic Surgery

## 2011-03-25 NOTE — H&P (Signed)
[name], [name]                   ACCOUNT NO.:  [id number]  MEDICAL RECORD NO.:  [id number]         PATIENT TYPE:  PAMB  LOCATION:  DAY                           FACILITY:  [facility]  PHYSICIAN:  Vickki Hearing, M.D.DATE OF BIRTH:  08/18/1951  DATE OF ADMISSION:  01/03/2011 DATE OF DISCHARGE:  LH                             HISTORY & PHYSICAL   CHIEF COMPLAINT:  Left knee pain.  This is a 60 year old female who injured her left knee approximately 6 years ago and eventually had a left knee arthroscopy with microfracture. She did well until about a year ago when she started having increased pain and swelling again in the left knee.  The pain came on insidiously and is described as a dull, deep ache, moderate in severity, initially intermittent, but has become more frequent, and is associated with weightbearing activities.  She denies catching or locking.  We did try nonoperative treatment but it was not successful and she has come to the realization that she needs further treatment on her knee.  Her pain will reach an 8/10 at times and her activities of daily living have become more difficult.  We have scheduled her for surgery pending workers' Data processing manager.  She is scheduled for a left total knee arthroplasty and she understands the risks and benefits of the procedure saying that she had a right total knee replacement and did well several years back.  She has no known drug allergies.  She has a past history of high cholesterol.  She has had arthroscopic surgery of both knees, a right total knee, and total abdominal hysterectomy, a hernia.  Her right total knee was in 2009.  MEDICATIONS:  Listed are potassium and Dyazide, needs correlation with the patient.  REVIEW OF SYSTEMS:  Other than stated is negative.  SOCIAL HISTORY:  She is married.  She does not smoke or drink.  FAMILY HISTORY:  Noncontributory for any serious illnesses.  PHYSICAL EXAMINATION:   VITAL SIGNS:  Stable. GENERAL:  She is well developed and well nourished, grooming and hygiene are normal.  There are no deformities. CARDIOVASCULAR:  Reveals no swelling or varicose veins.  Pulses and temperature are normal.  There was no edema or tenderness. SKIN:  Warm, dry, and intact with no lesions. NEUROLOGIC:  Shows normal sensation, coordination and reflexes.  Mood and affect are normal.  Lymph nodes in the cervical spine are benign and normal. MUSCULOSKELETAL:  She has normal strength, range of motion, stability, and alignment of her upper extremities.  Her right total knee is functioning very well with 115 degrees of knee flexion, full extension. Her knee is stable.  There is no tenderness or swelling or alignment issues.  Left knee, she has tenderness over the medial joint line.  She was noted to have flexion arc of 125 degrees, grade 5 motor strength. Her left knee has no skin issues.  X-rays show medial compartment joint space narrowing, consistent with osteoarthritis and mild varus.  PLAN:  Left total knee arthroplasty.  Informed consent was done in the office.     Vickki Hearing, M.D.  SEH/MEDQ  D:  11/30/2010  T:  12/01/2010  Job:  [id number]  Electronically Signed by Fuller Canada M.D. on 03/25/2011 10:56:48 AM

## 2011-03-28 ENCOUNTER — Telehealth: Payer: Self-pay | Admitting: *Deleted

## 2011-03-28 NOTE — Telephone Encounter (Signed)
rec'd a fax from [facility] asking for a refill on her Norco. I had the pharmacist check who the md was. It is her ortho md. She will take Dr. Maurice March off this & send to original prescriber. I asked that she use E rx with [place]

## 2011-03-29 NOTE — Telephone Encounter (Signed)
As of 03/25/11, message received from Jamelle Haring of [employer],worker's comp - verified physical therapy at [facility] is approved: [id number].  Patient also received this information directly from Ms.Pollie Meyer, and scheduled for first out-patient therapy appointment in [place] facility 03/28/11.

## 2011-03-29 NOTE — H&P (Signed)
NAMEMELVIN, MARMO                   ACCOUNT NO.:  [id number]   MEDICAL RECORD NO.:  [id number]         PATIENT TYPE:  PAMB   LOCATION:  DAY                           FACILITY:  [facility]   PHYSICIAN:  Vickki Hearing, M.D.DATE OF BIRTH:  [birth year]   DATE OF ADMISSION:  DATE OF DISCHARGE:  LH                              HISTORY & PHYSICAL   CHIEF COMPLAINT:  Degenerative joint disease, right knee and right knee  pain.   HISTORY:  This is a 60 year old female with increasing severe  nonradiating aching right knee pain.  She was treated with anti-  inflammatories, Lortab, injections, rest, attempts at weight loss, and  she did not improve.  She denies mechanical pain symptoms, but has pain  and stiffness after sitting, and constant pain throughout the day.  Her  radiographs show osteoarthritis which has progressed on x-ray and she  wishes to have total knee replacement.   PAST HISTORY, FAMILY HISTORY AND SOCIAL HISTORY:  Revealed the  following:  She had a total abdominal hysterectomy, herniorrhaphy and  arthroscopy of both knees.   ALLERGIES:  She has no known drug allergies.   MEDICATIONS:  1. She takes potassium.  2. Dyazide.  3. Lortab 2.5.  4. Relafen 500.   REVIEW OF SYSTEMS:  There are no major findings under review of systems   SOCIAL HISTORY:  She is married.  Does not smoke or drink   PHYSICAL EXAMINATION:  CONSTITUTIONAL FINDINGS:  She is well-developed  and nourished, grooming and hygiene are normal.  There are no  deformities.  VITAL SIGNS:  Stable.  CARDIOVASCULAR:  There is no swelling or varicose veins.  Pulses and  temperature normal.  No edema or tenderness.  SKIN:  Warm, dry, intact, no lesions.  NEURO:  Normal sensation, coordination, and reflexes.  PSYCHIATRIC:  Awake, alert, oriented x3.  Mood and affect normal.  LYMPH/CERVICAL SPINE:  Benign.  MUSCULOSKELETAL:  Motor strength 5/5 quads, hamstrings, ankle  dorsiflexors, and plantar flexors.  Gait  shows a mild-to-moderate limb.  Range of motion in the right knee is 115 degrees with full passive  extension.  Ligaments are stable, mild varus deformity noted.  Upper  extremities show no evidence of contracture, subluxation, atrophy, or  tremor.   IMPRESSION:  Osteoarthritis right knee.  Code 715.96.   PLAN:  Right total knee replacement.  Informed consent process has been  completed in the office.  Risks and benefits of surgery were explained.  Alternative treatments were explained; which included continued  medication and injection, more attempts at weight loss, supportive brace  and cane use, the patient opted for surgery.  Specific to this  procedure, risks of deep vein thrombosis, pulmonary embolus, infection  requiring two-stage revision, stiffness and continued pain were  discussed, and accepted by the patient.      Vickki Hearing, M.D.  Electronically Signed     SEH/MEDQ  D:  02/04/2008  T:  02/04/2008  Job:  [id number]   cc:   [facility]  Fax: [phone]

## 2011-03-29 NOTE — Op Note (Signed)
NAMEANNISE, [name]                   ACCOUNT NO.:  [id number]   MEDICAL RECORD NO.:  [id number]          PATIENT TYPE:  AMB   LOCATION:  DAY                           FACILITY:  [facility]   PHYSICIAN:  Vickki Hearing, M.D.DATE OF BIRTH:  December 27, 1950   DATE OF PROCEDURE:  02/05/2008  DATE OF DISCHARGE:                               OPERATIVE REPORT   HISTORY:  This is a 60 year old female with increasing severe  nonradiating aching right knee pain.  She was treated with anti-  inflammatories, Lortab, injections, rest, attempts at weight loss and  did not improve.  Although she denied mechanical symptoms, she had  significant pain and functional disability with stiffness after sitting  and pain throughout the day.  Radiographs showed progressive arthritis,  and after discussing the options, we both agreed that she would be a  good candidate for total knee replacement.   PREOPERATIVE DIAGNOSIS:  Osteoarthritis of the right knee.   POSTOPERATIVE DIAGNOSIS:  Osteoarthritis of the right knee.   PROCEDURE:  Right total knee arthroplasty.   COMPONENTS USED:  We used a DePuy Sigma fixed bearing posterior  stabilized implant with a 3 femur, 3 tibia, 32 patella, 10-mm  polyethylene insert.  We also used a pain pump catheter in the subcu  layer.   ANESTHESIA:  We used a spinal anesthetic.   OPERATIVE FINDINGS:  There was severe wear on the medial and lateral  femoral condyles.  There was parapatellar spurring.  There was intact  medial and lateral menisci with ACL, PCL intact. The tibial plateau was  only noted to have grade 1 changes.   SPECIMENS:  Specimens were sent to pathology.   DESCRIPTION OF PROCEDURE:  The patient was identified as Presenter, broadcasting.  She  marked the right knee as the surgical site.  I countersigned her  marking.  Her history and physical was updated.  She was taken to the  operating room where she had a spinal anesthetic without any untoward  events.  She was given a  gram of Ancef and placed supine on the  operating table.   A Foley catheter was inserted with ease.  A tourniquet was applied to  her right thigh.  After prep with DuraPrep and sterile draping, a time-  out was taken and the procedure was confirmed on the correct patient.  Implants were noted to be present.  Radiographs were hanging and  corresponded with the pathology noted.   The tourniquet was elevated to 300 mmHg after exsanguination of the  right lower extremity with a 6-inch Esmarch.  The knee was placed in  flexion.  A straight midline incision was used to approach the knee.  The medial parapatellar incision was used, and the patella was everted.  The menisci, ACL and PCL were resected.  The notch was stenotic.  An  osteotome was used to open up the notch.  The patellofemoral ligament  was released.  The tibial guide was set for a neutral cut with 10 mm  taken from the medial, referencing the medial tibial plateau.  The tibia  measured a 3 for the baseplate at this point.   We then introduced a drill bit into the femoral canal, decompressed it  with suction and irrigation.  Set the femur for a 10-mm distal cut with  5-degree valgus alignment.  We then checked the extension gap with a  spacer block.  I was not able to get the spacer block in.  I felt that  the tibial cut was too small as well as the femoral cut and took 2 mm  from each side of the joint.  This allowed the 10-mm spacer block to be  inserted.   We then measured the femur to a size 3, set the femur in 3 degrees  external rotation and made the 4 cuts.  We checked the flexion gap at  this point and rechecked the extension gap, and the 10-mm insert spacer  block fit very well with good collateral ligament stability.   We then made the box cut, measured the patella to a 22-mm thickness, cut  it down to a 14.  We remeasured it and confirmed.  Made 3 peg holes and  then irrigated the joint.  We put the trials in place  and ranged the  knee several times and set the tibial rotation.  The patella tracked  slightly with tilt; therefore, a lateral release was performed.  After  marking the tibial rotation, we then punched and reamed the canal and  then irrigated the joint and prepared it for cementing.   Two bags of cement were used.  They were mixed on the back table, and  the implants were cemented in place.  Excess cement was removed.  Cement  was allowed to cure. A second check for excess cement was made, and any  fragments of cement were also removed at that time.  The 10-mm trial  insert was replaced with the real insert.  The  knee was taken through a  range of motion.  Range of motion was 0 to 125 degrees.  Collateral  ligament stability was normal.  AP stability was normal at 90 degrees.   Radiographs confirmed position of the implants to be excellent.   The wound was injected 30 mL of Marcaine with epinephrine.  The extensor  mechanism was closed with interrupted Bralon suture in combination of  running suture.  We  then closed the subcu with 2-0 and 0 Monocryl over  a pain pump catheter.  Staples were used to reapproximate the skin.  Sterile dressings were applied.  The tourniquet was released. Cryo/Cuff  was applied.   The patient was taken to the recovery room in stable condition to be  placed in CPM and start a routine postop total knee protocol.      Vickki Hearing, M.D.  Electronically Signed     SEH/MEDQ  D:  02/05/2008  T:  02/05/2008  Job:  [id number]

## 2011-04-01 NOTE — Discharge Summary (Signed)
NAMEJENNET, [name]                   ACCOUNT NO.:  [id number]   MEDICAL RECORD NO.:  [id number]          PATIENT TYPE:  INP   LOCATION:  A329                          FACILITY:  [facility]   PHYSICIAN:  Vickki Hearing, M.D.DATE OF BIRTH:  26-Jun-1951   DATE OF ADMISSION:  02/05/2008  DATE OF DISCHARGE:  03/27/2009LH                               DISCHARGE SUMMARY   ADMITTING DIAGNOSIS:  Osteoarthritis of right knee.   DISCHARGE DIAGNOSIS:  Osteoarthritis of right knee.   OPERATIVE PROCEDURE:  Right total knee arthroplasty.   IMPLANTS USED:  We used a PFC Sigma DePuy posterior stabilized total  knee implant with a size-3 modular tibial tray, cemented size-3 femoral  component with lugs, and oval dome patella size 32.  We used SmartSet  bone cement and used a 10-mm cross-link stabilized insert.   OPERATIVE FINDINGS:  Osteoarthritis was found primarily over the medial  femoral condyle.   COMPLICATIONS:  There were no complications.   ANESTHESIA:  The surgery was done under spinal anesthetic.   HOSPITAL COURSE:  The patient followed a standard total knee protocol  with a CPM machine, early weightbearing, mobilization, and Coumadin.  She has tolerated this well, had physical therapy, mobilized well with a  walking distance of 300 feet.  Passive range of motion was 0-75 on February 08, 2008, which was the day of discharge.  Hemoglobin was 10.6 and her  pro time was 21.5 with an INR of 1.8 on February 07, 2008 and then on February 04, 2008, and 1.9 on the February 08, 2008.   DISCHARGE MEDICATIONS AND INSTRUCTIONS:  She was discharged on:  1. Darvocet 1-2 q.4 h. p.r.n. for pain.  2. Coumadin 2.5 mg daily for 7 days.  She was to resume:  1. Midrin 1-2 daily as needed.  2. Simvastatin 40 mg daily.  3. Triamterene and hydrochlorothiazide 1-2 capsules daily.  4. Citalopram 20 mg daily.  5. Klor-Con 10 mEq 3 tablets daily.   DISCHARGE DISPOSITION:  Home.   CONDITION:  Improved.   She is  scheduled to return in 2 weeks postop at my office.      Vickki Hearing, M.D.  Electronically Signed     SEH/MEDQ  D:  02/28/2008  T:  02/28/2008  Job:  [id number]

## 2011-04-13 NOTE — Group Therapy Note (Signed)
  NAMEHENREITTA, SPITTLER                   ACCOUNT NO.:  [id number]  MEDICAL RECORD NO.:  [id number]           PATIENT TYPE:  LOCATION:                                 FACILITY:  PHYSICIAN:  Harrold Fitchett G. Renard Matter, MD   DATE OF BIRTH:  [birth year]  DATE OF PROCEDURE: DATE OF DISCHARGE:                                PROGRESS NOTE   This patient was admitted to the hospital following a fall at home.  She apparently did not lose consciousness, but complained of pain over the right ribcage.  X-rays were obtained which did not show evidence of fracture.  CT of the head, generalized atrophy, no acute intracranial abnormality noted.  OBJECTIVE:  VITAL SIGNS:  Blood pressure 162/64, respirations 16, pulse 78, and temperature 97.8. LUNGS:  Diminished breath sounds.  Tenderness over the right ribcage. HEART:  Regular rhythm. ABDOMEN:  No palpable organs or masses.  IMPRESSION:  The patient had a fall at home, has contusion of right ribcage and generalized soreness.  The patient is scheduled for carotid Doppler ultrasound today.  We will proceed with this.  Continue current regimen.     Ayleen Mckinstry G. Renard Matter, MD     AGM/MEDQ  D:  04/07/2011  T:  04/07/2011  Job:  [id number]  Electronically Signed by Butch Penny MD on 04/13/2011 06:22:58 AM

## 2011-04-19 ENCOUNTER — Other Ambulatory Visit: Payer: Self-pay | Admitting: Orthopedic Surgery

## 2011-04-20 ENCOUNTER — Ambulatory Visit (INDEPENDENT_AMBULATORY_CARE_PROVIDER_SITE_OTHER): Payer: Worker's Compensation | Admitting: Orthopedic Surgery

## 2011-04-20 DIAGNOSIS — Z96659 Presence of unspecified artificial knee joint: Secondary | ICD-10-CM

## 2011-04-20 DIAGNOSIS — R200 Anesthesia of skin: Secondary | ICD-10-CM

## 2011-04-20 DIAGNOSIS — R209 Unspecified disturbances of skin sensation: Secondary | ICD-10-CM

## 2011-04-20 MED ORDER — HYDROCODONE-ACETAMINOPHEN 5-325 MG PO TABS: 1.0000 | ORAL_TABLET | ORAL | Status: AC | PRN

## 2011-04-20 MED ORDER — GABAPENTIN 100 MG PO CAPS: 100.0000 mg | ORAL_CAPSULE | Freq: Every day | ORAL | Status: DC

## 2011-04-20 NOTE — Progress Notes (Signed)
6 week followup LEFT total knee replacement  Patient concerned that her knee is not bending as much as the opposite RIGHT total knee.  Therapy notes indicate flexion passive 120.  Active range motion 106.  Examination unremarkable  Impression status post LEFT total knee with 106 of flexion making adequate progress  Plan continue physical therapy return in 6 weeks

## 2011-06-01 ENCOUNTER — Ambulatory Visit (INDEPENDENT_AMBULATORY_CARE_PROVIDER_SITE_OTHER): Payer: Worker's Compensation | Admitting: Orthopedic Surgery

## 2011-06-01 DIAGNOSIS — Z96659 Presence of unspecified artificial knee joint: Secondary | ICD-10-CM | POA: Insufficient documentation

## 2011-06-01 NOTE — Progress Notes (Signed)
  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

## 2011-07-09 ENCOUNTER — Encounter: Payer: Self-pay | Admitting: Orthopedic Surgery

## 2011-07-09 ENCOUNTER — Telehealth: Payer: Self-pay | Admitting: Orthopedic Surgery

## 2011-07-09 NOTE — Progress Notes (Signed)
  Clarification note  There seems to be some discrepancy regarding the patient's preoperative diagnosis and coded diagnosis in the chart  The patient had osteoarthritis localized to the knee joint. This should read diagnostic code 715.16. In the preop certification process a different code was used indicating osteoarthritis of the lower extremity.  Both diagnoses codes are correct the osteoarthritis was in the lower extremity in the knee joint.  Hopefully this will clarify the situation.  Sincerely Dr. Romeo Apple.

## 2011-07-09 NOTE — Telephone Encounter (Signed)
Message copied by Fuller Canada MD E on Sat Jul 09, 2011  4:40 PM ------      Message from: FOLTZ, CAROL A      Created: Fri Jul 08, 2011  1:00 PM      Regarding: code question not resolved       Here is copy of email from [facility] hospital coder Alba Destine, regarding the coding issue for surgery (hospital billing) for [facility], total knee surgery:            if dr Romeo Apple would come to our dept and add an addendum to the record (write something on a progress note) telling us if the osteoarthritis is localized or not,  we could then scan that paper into the record and change the code to a 715.16            From: Warden Fillers       Sent: Thursday, July 07, 2011 3:03 PM      To: Alba Destine; Cammie Sickle      Cc: Vinnie Level      Subject: RE: [employer] hospital billing issue MRN [id number] [phone] #UPDATE#            Dennie Bible,      Can the other code of 715.16 code be added?      We have 715.96 coded on the acct but [facility]OL has 715.16 approved.             Thanks,      Olegario Messier

## 2011-08-08 LAB — DIFFERENTIAL
Basophils Absolute: 0
Basophils Absolute: 0
Basophils Absolute: 0
Basophils Relative: 0
Basophils Relative: 0
Basophils Relative: 0
Eosinophils Absolute: 0.2
Eosinophils Absolute: 0.3
Eosinophils Relative: 1
Eosinophils Relative: 3
Monocytes Absolute: 0.7
Monocytes Absolute: 0.8
Monocytes Relative: 7
Monocytes Relative: 8
Monocytes Relative: 8
Neutro Abs: 6.1
Neutro Abs: 7.4
Neutrophils Relative %: 75

## 2011-08-08 LAB — BASIC METABOLIC PANEL
BUN: 14
BUN: 8
CO2: 29
CO2: 29
CO2: 30
Calcium: 8.5
Calcium: 8.5
Chloride: 101
Chloride: 103
Chloride: 107
Chloride: 98
Creatinine, Ser: 0.71
GFR calc Af Amer: >60
Glucose, Bld: 107 — ABNORMAL HIGH
Glucose, Bld: 142 — ABNORMAL HIGH
Glucose, Bld: 94
Glucose, Bld: 97
Potassium: 3.4 — ABNORMAL LOW
Potassium: 3.8
Sodium: 135
Sodium: 135

## 2011-08-08 LAB — CBC
HCT: 31.2 — ABNORMAL LOW
Hemoglobin: 10.6 — ABNORMAL LOW
Hemoglobin: 11.2 — ABNORMAL LOW
MCHC: 35.1
MCHC: 35.4
MCHC: 35.8
MCV: 86
MCV: 86.5
MCV: 86.7
Platelets: 174
RBC: 3.48 — ABNORMAL LOW
RBC: 3.48 — ABNORMAL LOW
RBC: 3.64 — ABNORMAL LOW
RBC: 4.59
RDW: 12.9
RDW: 13.2
RDW: 13.4
WBC: 7

## 2011-08-08 LAB — CROSSMATCH
ABO/RH(D): B POS
Antibody Screen: NEGATIVE

## 2011-08-08 LAB — PROTIME-INR
INR: 0.9
INR: 1.9 — ABNORMAL HIGH
Prothrombin Time: 12.2

## 2011-08-08 LAB — ABO/RH: ABO/RH(D): B POS

## 2011-08-08 LAB — APTT: aPTT: 28

## 2011-09-06 ENCOUNTER — Ambulatory Visit: Payer: Self-pay | Admitting: Orthopedic Surgery

## 2011-09-07 ENCOUNTER — Ambulatory Visit: Payer: Self-pay | Admitting: Orthopedic Surgery

## 2011-09-13 ENCOUNTER — Ambulatory Visit (INDEPENDENT_AMBULATORY_CARE_PROVIDER_SITE_OTHER): Payer: Worker's Compensation | Admitting: Orthopedic Surgery

## 2011-09-13 ENCOUNTER — Encounter: Payer: Self-pay | Admitting: Orthopedic Surgery

## 2011-09-13 VITALS — Ht 63.5 in | Wt 190.0 lb

## 2011-09-13 DIAGNOSIS — Z96659 Presence of unspecified artificial knee joint: Secondary | ICD-10-CM

## 2011-09-13 NOTE — Patient Instructions (Signed)
Continue normal activity

## 2011-09-13 NOTE — Progress Notes (Signed)
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  She ambulated very well during her vacation walking 3-5 miles per day. She does complain of some weakness in the LEFT leg, which I think, is related to the neuropathy.  She is to continue Neurontin, normal activities. Follow up in 6 months for one year x-ray LEFT knee

## 2011-10-10 ENCOUNTER — Other Ambulatory Visit: Payer: Self-pay | Admitting: Orthopedic Surgery

## 2011-10-11 ENCOUNTER — Other Ambulatory Visit: Payer: Self-pay | Admitting: Orthopedic Surgery

## 2011-10-11 DIAGNOSIS — M792 Neuralgia and neuritis, unspecified: Secondary | ICD-10-CM

## 2011-10-11 MED ORDER — GABAPENTIN 100 MG PO CAPS: 100.0000 mg | ORAL_CAPSULE | Freq: Every day | ORAL | Status: DC

## 2012-03-13 ENCOUNTER — Ambulatory Visit (INDEPENDENT_AMBULATORY_CARE_PROVIDER_SITE_OTHER): Payer: BC Managed Care – PPO

## 2012-03-13 ENCOUNTER — Ambulatory Visit (INDEPENDENT_AMBULATORY_CARE_PROVIDER_SITE_OTHER): Admitting: Orthopedic Surgery

## 2012-03-13 ENCOUNTER — Encounter: Payer: Self-pay | Admitting: Orthopedic Surgery

## 2012-03-13 VITALS — BP 98/70 | Ht 63.5 in | Wt 190.0 lb

## 2012-03-13 DIAGNOSIS — Z96659 Presence of unspecified artificial knee joint: Secondary | ICD-10-CM

## 2012-03-13 NOTE — Progress Notes (Signed)
Subjective:     Patient ID: [name], female   DOB: 15-Dec-1950, 61 y.o.   MRN: [id number] C/O pain left knee Chief Complaint  Patient presents with  . Follow-up    yearly recheck and xray left knee, TKA 03/07/11    HPI  Bilateral knee replacements: doing reasonably well/ c/o swelling pain and stiffness occasionally in the left knee with not as much flexion.  The right knee apparently is doing well without complaint   Review of Systems Negative review of systems    Objective:   Physical Exam BP 98/70  Ht 5' 3.5" (1.613 m)  Wt 190 lb (86.183 kg)  BMI 33.13 kg/m2 Flexion on the right knee is 118 on the left knee is 112  She has good extension power in both lower extremities. Both knees are stable. No tenderness or swelling is noted.     Assessment:     X-rays show normal alignment and no loosening in the left knee    Plan:     Followup in a year for x-rays of both total knees

## 2012-03-13 NOTE — Patient Instructions (Signed)
We will see you next year

## 2012-03-22 ENCOUNTER — Encounter: Payer: Self-pay | Admitting: Orthopedic Surgery

## 2012-03-22 NOTE — Treatment Plan (Signed)
Fuller Canada, MD 03/13/2012 10:54 AM Signed  Subjective:   Patient ID: [name], female DOB: June 18, 1951, 61 y.o. MRN: [id number]  C/O pain left knee  Chief Complaint   Patient presents with   .  Follow-up     yearly recheck and xray left knee, TKA 03/07/11    HPI  Bilateral knee replacements: doing reasonably well/ c/o swelling pain and stiffness occasionally in the left knee with not as much flexion.  The right knee apparently is doing well without complaint  Review of Systems  Negative review of systems   Objective:   Physical Exam  BP 98/70  Ht 5' 3.5" (1.613 m)  Wt 190 lb (86.183 kg)  BMI 33.13 kg/m2  Flexion on the right knee is 118 on the left knee is 112    She has good extension power in both lower extremities. Both knees are stable. No tenderness or swelling is noted.   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 The patient is also having some difficulty with range of motion and occasional pain therefore we have added an additional 2 and half percent. She is also having some weakness in terms of climbing stairs and there is an additional 2-1/2% for that for a total of 25% permanent partial impairment left total knee.

## 2012-10-25 ENCOUNTER — Telehealth: Payer: Self-pay | Admitting: Orthopedic Surgery

## 2012-10-25 NOTE — Telephone Encounter (Signed)
Please find form in your box - review per patient request, which she has received from insurance ([employer].) Her ph # is [phone].

## 2013-03-14 ENCOUNTER — Ambulatory Visit: Payer: Self-pay | Admitting: Orthopedic Surgery

## 2013-03-28 ENCOUNTER — Ambulatory Visit (INDEPENDENT_AMBULATORY_CARE_PROVIDER_SITE_OTHER): Payer: BC Managed Care – PPO | Admitting: Orthopedic Surgery

## 2013-03-28 ENCOUNTER — Encounter: Payer: Self-pay | Admitting: Orthopedic Surgery

## 2013-03-28 ENCOUNTER — Ambulatory Visit (INDEPENDENT_AMBULATORY_CARE_PROVIDER_SITE_OTHER): Payer: BC Managed Care – PPO

## 2013-03-28 VITALS — BP 120/76 | Ht 63.5 in | Wt 193.0 lb

## 2013-03-28 DIAGNOSIS — Z96651 Presence of right artificial knee joint: Secondary | ICD-10-CM

## 2013-03-28 DIAGNOSIS — Z96659 Presence of unspecified artificial knee joint: Secondary | ICD-10-CM

## 2013-03-28 DIAGNOSIS — Z96652 Presence of left artificial knee joint: Secondary | ICD-10-CM

## 2013-03-28 NOTE — Patient Instructions (Addendum)
Activities as tolerated followup will be in 2 years

## 2013-03-28 NOTE — Progress Notes (Signed)
Patient ID: [name], female   DOB: 1951/11/11, 62 y.o.   MRN: [id number] Chief Complaint  Patient presents with  . Follow-up    yearly recheck of right knee replacement with xray. DOS 02-05-08.   Implant DePuy  Review of systems patient has no complaints.  Exam Physical Exam(6) GENERAL: normal development   CDV: pulses are normal   Skin: normal  Psychiatric: awake, alert and oriented  Neuro: normal sensation  1 ambulation normal without assistive device 2 ROM = 125 3 Motor normal  4 Stability normal   Separate x-ray report.  Reason for x-ray, and we'll x-ray follow-up knee replacement.  3 views left knee.  The implant is aligned normally. There is no loosening.  Impression normal appearing knee replacement.    Assessment: Knee replacement functioning well    Plan: One year follow

## 2013-04-02 ENCOUNTER — Ambulatory Visit: Admitting: Orthopedic Surgery

## 2013-04-16 ENCOUNTER — Ambulatory Visit (INDEPENDENT_AMBULATORY_CARE_PROVIDER_SITE_OTHER): Admitting: Orthopedic Surgery

## 2013-04-16 ENCOUNTER — Encounter: Payer: Self-pay | Admitting: Orthopedic Surgery

## 2013-04-16 ENCOUNTER — Ambulatory Visit (INDEPENDENT_AMBULATORY_CARE_PROVIDER_SITE_OTHER)

## 2013-04-16 VITALS — Ht 63.5 in | Wt 193.0 lb

## 2013-04-16 DIAGNOSIS — Z96659 Presence of unspecified artificial knee joint: Secondary | ICD-10-CM

## 2013-04-16 DIAGNOSIS — Z96652 Presence of left artificial knee joint: Secondary | ICD-10-CM

## 2013-04-16 NOTE — Progress Notes (Signed)
Patient ID: [name], female   DOB: Jun 08, 1951, 62 y.o.   MRN: [id number] Chief Complaint  Patient presents with  . Follow-up    Yearly recheck left TKA. DOS 03-07-11. Xray today.   Chief complaint total knee follow-up.  History this is a follow-up visit. Status post  Left  total knee replacement.  DOS: 03/07/2011 Implant DePuy  Review of systems patient has no complaints.  Exam Physical Exam(6) GENERAL: normal development   CDV: pulses are normal   Skin: normal  Psychiatric: awake, alert and oriented  Neuro: normal sensation  1 ambulation normal no assistive devices  2 ROM = 120 3 Motor normal  4 Stability normal   Separate x-ray report.  Reason for x-ray, and we'll x-ray follow-up knee replacement.  3 views left  knee.  The implant is aligned normally. There is no loosening.  Impression normal appearing knee replacement.    Assessment: Knee replacement functioning well    Plan: One year follow

## 2014-04-10 ENCOUNTER — Ambulatory Visit (INDEPENDENT_AMBULATORY_CARE_PROVIDER_SITE_OTHER): Payer: BC Managed Care – PPO

## 2014-04-10 ENCOUNTER — Ambulatory Visit (INDEPENDENT_AMBULATORY_CARE_PROVIDER_SITE_OTHER): Payer: BC Managed Care – PPO | Admitting: Orthopedic Surgery

## 2014-04-10 VITALS — BP 122/74 | Ht 63.5 in | Wt 194.0 lb

## 2014-04-10 DIAGNOSIS — M1710 Unilateral primary osteoarthritis, unspecified knee: Secondary | ICD-10-CM

## 2014-04-10 DIAGNOSIS — Z96659 Presence of unspecified artificial knee joint: Secondary | ICD-10-CM

## 2014-04-10 DIAGNOSIS — M179 Osteoarthritis of knee, unspecified: Secondary | ICD-10-CM

## 2014-04-10 DIAGNOSIS — IMO0002 Reserved for concepts with insufficient information to code with codable children: Secondary | ICD-10-CM

## 2014-04-10 DIAGNOSIS — Z96651 Presence of right artificial knee joint: Secondary | ICD-10-CM

## 2014-04-10 NOTE — Progress Notes (Signed)
Patient ID: [name], female   DOB: 12-17-1950, 63 y.o.   MRN: [id number]   Chief Complaint  Patient presents with  . Follow-up    yearly recheck of right knee replacement with xray. DOS 02/05/2008     History this is the  6  Yr annual followup status post right  knee replacement  The patient is not having any pain in the knee The patient's function with normal activities is excellent   Review of systems musculoskeletal the patient denies catching locking or giving way in the knee    General appearance is normal, the patient is alert and oriented x3 with normal mood and affect. Knee flexion is  Normal  The knee is stable in the anterior posterior and medial lateral plane There is no tenderness or swelling  Status post right  total knee  A followup x-ray will be performed at one year from now Xray report  3 views of the right total knee postop year #6 shows no prosthetic loosening or malalignment. No mechanical problems are noted on x-ray  Impression stable prosthesis

## 2014-04-10 NOTE — Patient Instructions (Signed)
1 week xray other knee

## 2014-04-22 ENCOUNTER — Ambulatory Visit (INDEPENDENT_AMBULATORY_CARE_PROVIDER_SITE_OTHER)

## 2014-04-22 ENCOUNTER — Ambulatory Visit (INDEPENDENT_AMBULATORY_CARE_PROVIDER_SITE_OTHER): Admitting: Orthopedic Surgery

## 2014-04-22 VITALS — BP 109/58 | Ht 63.5 in | Wt 194.0 lb

## 2014-04-22 DIAGNOSIS — M179 Osteoarthritis of knee, unspecified: Secondary | ICD-10-CM

## 2014-04-22 DIAGNOSIS — M1710 Unilateral primary osteoarthritis, unspecified knee: Secondary | ICD-10-CM

## 2014-04-22 DIAGNOSIS — Z96659 Presence of unspecified artificial knee joint: Secondary | ICD-10-CM

## 2014-04-22 DIAGNOSIS — IMO0002 Reserved for concepts with insufficient information to code with codable children: Secondary | ICD-10-CM | POA: Diagnosis not present

## 2014-04-22 NOTE — Progress Notes (Signed)
Patient ID: [name], female   DOB: Apr 11, 1951, 63 y.o.   MRN: [id number]  Chief Complaint  Patient presents with  . Follow-up    Yearly follow up left TKA workers comp DOS 03/07/11    Annual films left knee no complaints  X-rays ordered interpreted and reviewed they look normal  Followup in one year for repeat films

## 2015-04-10 ENCOUNTER — Emergency Department (HOSPITAL_COMMUNITY): Payer: BLUE CROSS/BLUE SHIELD

## 2015-04-10 ENCOUNTER — Other Ambulatory Visit (HOSPITAL_COMMUNITY): Payer: Self-pay

## 2015-04-10 ENCOUNTER — Encounter (HOSPITAL_COMMUNITY): Payer: Self-pay

## 2015-04-10 ENCOUNTER — Inpatient Hospital Stay (HOSPITAL_COMMUNITY)
Admission: EM | Admit: 2015-04-10 | Discharge: 2015-04-13 | DRG: 494 | Disposition: A | Discharge status: Home Health | Payer: BLUE CROSS/BLUE SHIELD | Attending: Orthopedic Surgery | Admitting: Orthopedic Surgery

## 2015-04-10 DIAGNOSIS — M792 Neuralgia and neuritis, unspecified: Secondary | ICD-10-CM

## 2015-04-10 DIAGNOSIS — S82892A Other fracture of left lower leg, initial encounter for closed fracture: Secondary | ICD-10-CM

## 2015-04-10 DIAGNOSIS — S82842A Displaced bimalleolar fracture of left lower leg, initial encounter for closed fracture: Principal | ICD-10-CM | POA: Diagnosis present

## 2015-04-10 DIAGNOSIS — S82899A Other fracture of unspecified lower leg, initial encounter for closed fracture: Secondary | ICD-10-CM

## 2015-04-10 DIAGNOSIS — I10 Essential (primary) hypertension: Secondary | ICD-10-CM | POA: Diagnosis present

## 2015-04-10 DIAGNOSIS — Y92017 Garden or yard in single-family (private) house as the place of occurrence of the external cause: Secondary | ICD-10-CM

## 2015-04-10 DIAGNOSIS — T148XXA Other injury of unspecified body region, initial encounter: Secondary | ICD-10-CM

## 2015-04-10 DIAGNOSIS — E785 Hyperlipidemia, unspecified: Secondary | ICD-10-CM | POA: Diagnosis present

## 2015-04-10 DIAGNOSIS — M25572 Pain in left ankle and joints of left foot: Secondary | ICD-10-CM | POA: Diagnosis present

## 2015-04-10 DIAGNOSIS — W010XXA Fall on same level from slipping, tripping and stumbling without subsequent striking against object, initial encounter: Secondary | ICD-10-CM | POA: Diagnosis present

## 2015-04-10 DIAGNOSIS — E876 Hypokalemia: Secondary | ICD-10-CM | POA: Diagnosis present

## 2015-04-10 DIAGNOSIS — Z01810 Encounter for preprocedural cardiovascular examination: Secondary | ICD-10-CM

## 2015-04-10 DIAGNOSIS — S82843A Displaced bimalleolar fracture of unspecified lower leg, initial encounter for closed fracture: Secondary | ICD-10-CM | POA: Diagnosis present

## 2015-04-10 DIAGNOSIS — S82842D Displaced bimalleolar fracture of left lower leg, subsequent encounter for closed fracture with routine healing: Secondary | ICD-10-CM

## 2015-04-10 LAB — BASIC METABOLIC PANEL
Anion gap: 12 (ref 5–15)
BUN: 17 mg/dL (ref 6–20)
CO2: 26 mmol/L (ref 22–32)
Calcium: 9.3 mg/dL (ref 8.9–10.3)
Chloride: 100 mmol/L — ABNORMAL LOW (ref 101–111)
Creatinine, Ser: 0.84 mg/dL (ref 0.44–1.00)
GLUCOSE: 109 mg/dL — AB (ref 65–99)
POTASSIUM: 3.1 mmol/L — AB (ref 3.5–5.1)
SODIUM: 138 mmol/L (ref 135–145)

## 2015-04-10 LAB — CBC
HCT: 40.1 % (ref 36.0–46.0)
HEMOGLOBIN: 13.5 g/dL (ref 12.0–15.0)
MCH: 29.3 pg (ref 26.0–34.0)
MCHC: 33.7 g/dL (ref 30.0–36.0)
MCV: 87 fL (ref 78.0–100.0)
PLATELETS: 229 10*3/uL (ref 150–400)
RBC: 4.61 MIL/uL (ref 3.87–5.11)
RDW: 12.5 % (ref 11.5–15.5)
WBC: 16 10*3/uL — ABNORMAL HIGH (ref 4.0–10.5)

## 2015-04-10 LAB — PROTIME-INR
INR: 0.97 (ref 0.00–1.49)
Prothrombin Time: 13.1 seconds (ref 11.6–15.2)

## 2015-04-10 LAB — TYPE AND SCREEN
ABO/RH(D): B POS
Antibody Screen: NEGATIVE

## 2015-04-10 LAB — APTT: aPTT: 23 seconds — ABNORMAL LOW (ref 24–37)

## 2015-04-10 MED ORDER — ONDANSETRON 4 MG PO TBDP
4.0000 mg | ORAL_TABLET | Freq: Once | ORAL | Status: AC
  Administered 2015-04-10: 4 mg via ORAL
  Filled 2015-04-10: qty 1

## 2015-04-10 MED ORDER — HYDROMORPHONE HCL 1 MG/ML IJ SOLN
0.5000 mg | INTRAMUSCULAR | Status: DC | PRN
  Administered 2015-04-11: 1 mg via INTRAVENOUS
  Administered 2015-04-11: 0.5 mg via INTRAVENOUS
  Administered 2015-04-11 – 2015-04-12 (×2): 1 mg via INTRAVENOUS
  Filled 2015-04-10 (×4): qty 1

## 2015-04-10 MED ORDER — POTASSIUM CHLORIDE 10 MEQ/100ML IV SOLN
10.0000 meq | INTRAVENOUS | Status: AC
  Administered 2015-04-11 (×2): 10 meq via INTRAVENOUS
  Filled 2015-04-10: qty 100

## 2015-04-10 MED ORDER — ONDANSETRON HCL 4 MG/2ML IJ SOLN: 4.0000 mg | Freq: Four times a day (QID) | INTRAMUSCULAR | Status: DC | PRN

## 2015-04-10 MED ORDER — HYDROCODONE-ACETAMINOPHEN 5-325 MG PO TABS: 1.0000 | ORAL_TABLET | ORAL | Status: DC | PRN

## 2015-04-10 MED ORDER — MORPHINE SULFATE 4 MG/ML IJ SOLN
4.0000 mg | Freq: Once | INTRAMUSCULAR | Status: AC
  Administered 2015-04-10: 4 mg via INTRAMUSCULAR
  Filled 2015-04-10: qty 1

## 2015-04-10 MED ORDER — SODIUM CHLORIDE 0.9 % IV SOLN
INTRAVENOUS | Status: DC
  Administered 2015-04-11 (×2): 100 mL/h via INTRAVENOUS

## 2015-04-10 MED ORDER — SODIUM CHLORIDE 0.9 % IV SOLN
INTRAVENOUS | Status: DC | PRN
  Administered 2015-04-10: 100 mL/h via INTRAVENOUS

## 2015-04-10 MED ORDER — FENTANYL CITRATE (PF) 100 MCG/2ML IJ SOLN
50.0000 ug | Freq: Once | INTRAMUSCULAR | Status: AC
  Administered 2015-04-10: 50 ug via INTRAVENOUS
  Filled 2015-04-10: qty 2

## 2015-04-10 MED ORDER — PROPOFOL 10 MG/ML IV BOLUS
INTRAVENOUS | Status: AC | PRN
  Administered 2015-04-10: 86.2 mg via INTRAVENOUS

## 2015-04-10 MED ORDER — BUTALBITAL-APAP-CAFFEINE 50-325-40 MG PO TABS: 1.0000 | ORAL_TABLET | Freq: Three times a day (TID) | ORAL | Status: DC | PRN

## 2015-04-10 MED ORDER — TRIAMTERENE-HCTZ 37.5-25 MG PO CAPS
2.0000 | ORAL_CAPSULE | Freq: Every day | ORAL | Status: DC
  Filled 2015-04-10 (×3): qty 2

## 2015-04-10 MED ORDER — CITALOPRAM HYDROBROMIDE 20 MG PO TABS
20.0000 mg | ORAL_TABLET | Freq: Every day | ORAL | Status: DC
  Administered 2015-04-12 – 2015-04-13 (×2): 20 mg via ORAL
  Filled 2015-04-10 (×4): qty 1

## 2015-04-10 MED ORDER — SIMVASTATIN 20 MG PO TABS
40.0000 mg | ORAL_TABLET | Freq: Every day | ORAL | Status: DC
  Administered 2015-04-11 – 2015-04-12 (×2): 40 mg via ORAL
  Filled 2015-04-10 (×2): qty 2

## 2015-04-10 MED ORDER — FENTANYL CITRATE (PF) 100 MCG/2ML IJ SOLN
50.0000 ug | Freq: Once | INTRAMUSCULAR | Status: AC
  Administered 2015-04-10: 50 ug via INTRAVENOUS

## 2015-04-10 MED ORDER — FENTANYL CITRATE (PF) 100 MCG/2ML IJ SOLN
INTRAMUSCULAR | Status: AC | PRN
  Administered 2015-04-10 (×2): 50 ug via INTRAVENOUS

## 2015-04-10 MED ORDER — ZOLPIDEM TARTRATE 5 MG PO TABS: 10.0000 mg | ORAL_TABLET | Freq: Every evening | ORAL | Status: DC | PRN

## 2015-04-10 MED ORDER — PROPOFOL 10 MG/ML IV BOLUS
1.0000 mg/kg | Freq: Once | INTRAVENOUS | Status: AC
  Administered 2015-04-10: 86.2 mg via INTRAVENOUS
  Filled 2015-04-10: qty 20

## 2015-04-10 MED ORDER — SODIUM CHLORIDE 0.9 % IV SOLN
INTRAVENOUS | Status: DC
  Administered 2015-04-10: 22:00:00 via INTRAVENOUS

## 2015-04-10 MED ORDER — POTASSIUM CHLORIDE CRYS ER 20 MEQ PO TBCR
40.0000 meq | EXTENDED_RELEASE_TABLET | Freq: Three times a day (TID) | ORAL | Status: DC
  Administered 2015-04-11 – 2015-04-12 (×2): 40 meq via ORAL
  Filled 2015-04-10 (×2): qty 2

## 2015-04-10 NOTE — ED Notes (Signed)
Obvious deformity noted to left ankle. + pedal pulse

## 2015-04-10 NOTE — ED Notes (Signed)
Report given to floor nurse, all questions answered  

## 2015-04-10 NOTE — ED Notes (Signed)
My son's dog ran into me out in the yard. My left ankle is broken per pt.

## 2015-04-10 NOTE — ED Provider Notes (Signed)
TIME SEEN: 7:55 PM  CHIEF COMPLAINT: left ankle injury  HPI: Pt is a 64 y.o. F with h/o HTN, HLD who presents to the emergency department with left ankle injury. Reports that her son's dog ran into her in the yard and caused her to twist her ankle and follow the ground. She has obvious deformity to the left ankle. Denies hitting her head. Not on anticoagulation. Denies any other injury. Denies numbness, tingling or focal weakness.  ROS: See HPI Constitutional: no fever  Eyes: no drainage  ENT: no runny nose   Cardiovascular:  no chest pain  Resp: no SOB  GI: no vomiting GU: no dysuria Integumentary: no rash  Allergy: no hives  Musculoskeletal: no leg swelling  Neurological: no slurred speech ROS otherwise negative  PAST MEDICAL HISTORY/PAST SURGICAL HISTORY:  History reviewed. No pertinent past medical history.  MEDICATIONS:  Prior to Admission medications   Medication Sig Start Date End Date Taking? Authorizing Provider  butalbital-acetaminophen-caffeine (FIORICET, ESGIC) 50-325-40 MG per tablet Take 1-2 tablets by mouth every 8 (eight) hours as needed. For migraine pain relief 03/17/15   Historical Provider, MD  citalopram (CELEXA) 20 MG tablet Take 20 mg by mouth daily.     Historical Provider, MD  gabapentin (NEURONTIN) 100 MG capsule Take 1 capsule (100 mg total) by mouth at bedtime. 10/11/11 10/10/12  Vickki Hearing, MD  potassium chloride (MICRO-K) 10 MEQ CR capsule Take 2 capsules by mouth 2 (two) times daily. 02/16/15   Historical Provider, MD  POTASSIUM PO Take by mouth.      Historical Provider, MD  simvastatin (ZOCOR) 40 MG tablet  02/24/11   Historical Provider, MD  triamterene-hydrochlorothiazide (DYAZIDE) 37.5-25 MG per capsule Take 2 capsules by mouth daily. 02/16/15   Historical Provider, MD  zolpidem (AMBIEN) 10 MG tablet  05/14/11   Historical Provider, MD    ALLERGIES:  Allergies  Allergen Reactions  . Erythromycin Nausea And Vomiting    SOCIAL HISTORY:   History  Substance Use Topics  . Smoking status: Never Smoker   . Smokeless tobacco: Not on file  . Alcohol Use: No    FAMILY HISTORY: No family history on file.  EXAM: BP 143/65 mmHg  Pulse 80  Temp(Src) 98.3 F (36.8 C) (Oral)  Resp 18  Ht  (1.626 m)  Wt 190 lb (86.183 kg)  BMI 32.60 kg/m2  SpO2 100% CONSTITUTIONAL: Alert and oriented and responds appropriately to questions. Well-appearing; well-nourished; GCS 15 HEAD: Normocephalic; atraumatic EYES: Conjunctivae clear, PERRL, EOMI ENT: normal nose; no rhinorrhea; moist mucous membranes; pharynx without lesions noted; no dental injury; no septal hematoma NECK: Supple, no meningismus, no LAD; no midline spinal tenderness, step-off or deformity CARD: RRR; S1 and S2 appreciated; no murmurs, no clicks, no rubs, no gallops RESP: Normal chest excursion without splinting or tachypnea; breath sounds clear and equal bilaterally; no wheezes, no rhonchi, no rales; no hypoxia or respiratory distress CHEST:  chest wall stable, no crepitus or ecchymosis or deformity, nontender to palpation ABD/GI: Normal bowel sounds; non-distended; soft, non-tender, no rebound, no guarding PELVIS:  stable, nontender to palpation BACK:  The back appears normal and is non-tender to palpation, there is no CVA tenderness; no midline spinal tenderness, step-off or deformity EXT: Patient has an obvious left ankle deformity with mild tenting of the skin over the lateral malleolus and ecchymosis with swelling, 2+ DP pulses bilaterally, reports normal sensation diffusely, no tenderness at the proximal fibular head on the left, status post bilateral knee  replacement, otherwise Normal ROM in all joints; otherwise extremities are non-tender to palpation; no edema; normal capillary refill; no cyanosis, no bony tenderness or bony deformity of patient's extremities, no joint effusion, no ecchymosis or lacerations    SKIN: Normal color for age and race; warm NEURO:  Moves all extremities equally, sensation to light touch intact diffusely, cranial nerves II through XII intact PSYCH: The patient's mood and manner are appropriate. Grooming and personal hygiene are appropriate.  MEDICAL DECISION MAKING: Patient here with trimalleolar fracture of the left ankle with displacement of the talus, shortening of the fibular fracture. Discussed with Dr. Romeo AppleHarrison with orthopedic surgery who recommends medicine admission and he will see the patient in the morning. Will reduce patient's fracture, dislocation using propofol, fentanyl. She has been nothing by mouth since 11:30 AM. Allergic only to erythromycin. No history of cardiac disease. No history of difficult intubation or problems with anesthesia. Patient consented at bedside.   ED PROGRESS: Ankle reduced and splinted using propofol. Patient's labs show leukocytosis which is likely reactive. Otherwise workup is unremarkable. We'll admit to hospitalist service to medical bed. Orthopedics will see tomorrow.      Reduction of dislocation Date/Time: 2140 Performed by: Raelyn NumberWARD, Ellarae Nevitt N Authorized by: Raelyn NumberWARD, Babara Buffalo N Consent: Verbal consent obtained. Risks and benefits: risks, benefits and alternatives were discussed Consent given by: patient Required items: required blood products, implants, devices, and special equipment available Time out: Immediately prior to procedure a "time out" was called to verify the correct patient, procedure, equipment, support staff and site/side marked as required.  Patient sedated: Using propofol  Vitals: Vital signs were monitored during sedation. Patient tolerance: Patient tolerated the procedure well with no immediate complications. Joint: Left ankle Reduction technique: Traction       Procedural sedation Performed by: Raelyn NumberWARD, Tiwanna Tuch N Consent: Verbal consent obtained. Risks and benefits: risks, benefits and alternatives were discussed Required items: required blood  products, implants, devices, and special equipment available Patient identity confirmed: arm band and provided demographic data Time out: Immediately prior to procedure a "time out" was called to verify the correct patient, procedure, equipment, support staff and site/side marked as required.  Sedation type: moderate (conscious) sedation NPO time confirmed and considedered  Sedatives: PROPOFOL  Physician Time at Bedside: 2138 - 2145  Vitals: Vital signs were monitored during sedation. Cardiac Monitor, pulse oximeter Patient tolerance: Patient tolerated the procedure well with no immediate complications. Comments: Pt with uneventful recovered. Returned to pre-procedural sedation baseline      Layla MawKristen N Mecca Barga, DO 04/10/15 2200

## 2015-04-11 ENCOUNTER — Inpatient Hospital Stay (HOSPITAL_COMMUNITY): Payer: BLUE CROSS/BLUE SHIELD | Admitting: Anesthesiology

## 2015-04-11 ENCOUNTER — Inpatient Hospital Stay (HOSPITAL_COMMUNITY): Payer: BLUE CROSS/BLUE SHIELD

## 2015-04-11 ENCOUNTER — Encounter (HOSPITAL_COMMUNITY)
Admission: EM | Disposition: A | Discharge status: Home Health | Payer: Self-pay | Source: Home / Self Care | Attending: Orthopedic Surgery

## 2015-04-11 DIAGNOSIS — S82843A Displaced bimalleolar fracture of unspecified lower leg, initial encounter for closed fracture: Secondary | ICD-10-CM | POA: Diagnosis present

## 2015-04-11 DIAGNOSIS — S82842A Displaced bimalleolar fracture of left lower leg, initial encounter for closed fracture: Principal | ICD-10-CM

## 2015-04-11 HISTORY — PX: ORIF ANKLE FRACTURE: SHX5408

## 2015-04-11 LAB — URINALYSIS, ROUTINE W REFLEX MICROSCOPIC
Bilirubin Urine: NEGATIVE
Glucose, UA: NEGATIVE mg/dL
Hgb urine dipstick: NEGATIVE
Ketones, ur: NEGATIVE mg/dL
Leukocytes, UA: NEGATIVE
Nitrite: NEGATIVE
PH: 5.5 (ref 5.0–8.0)
Protein, ur: NEGATIVE mg/dL
Specific Gravity, Urine: 1.015 (ref 1.005–1.030)
Urobilinogen, UA: 0.2 mg/dL (ref 0.0–1.0)

## 2015-04-11 LAB — BASIC METABOLIC PANEL
Anion gap: 9 (ref 5–15)
BUN: 12 mg/dL (ref 6–20)
CHLORIDE: 101 mmol/L (ref 101–111)
CO2: 27 mmol/L (ref 22–32)
Calcium: 8.6 mg/dL — ABNORMAL LOW (ref 8.9–10.3)
Creatinine, Ser: 0.7 mg/dL (ref 0.44–1.00)
GFR calc Af Amer: >60 mL/min (ref >60)
Glucose, Bld: 108 mg/dL — ABNORMAL HIGH (ref 65–99)
POTASSIUM: 3 mmol/L — AB (ref 3.5–5.1)
Sodium: 137 mmol/L (ref 135–145)

## 2015-04-11 LAB — SURGICAL PCR SCREEN
MRSA, PCR: POSITIVE — AB
Staphylococcus aureus: POSITIVE — AB

## 2015-04-11 LAB — CBC
HCT: 36.3 % (ref 36.0–46.0)
HEMOGLOBIN: 12.1 g/dL (ref 12.0–15.0)
MCH: 29.9 pg (ref 26.0–34.0)
MCHC: 33.3 g/dL (ref 30.0–36.0)
MCV: 89.6 fL (ref 78.0–100.0)
Platelets: 206 10*3/uL (ref 150–400)
RBC: 4.05 MIL/uL (ref 3.87–5.11)
RDW: 12.9 % (ref 11.5–15.5)
WBC: 9.3 10*3/uL (ref 4.0–10.5)

## 2015-04-11 SURGERY — OPEN REDUCTION INTERNAL FIXATION (ORIF) ANKLE FRACTURE
Anesthesia: General | Site: Ankle | Laterality: Left

## 2015-04-11 MED ORDER — ASPIRIN EC 325 MG PO TBEC
325.0000 mg | DELAYED_RELEASE_TABLET | Freq: Every day | ORAL | Status: DC
  Administered 2015-04-12 – 2015-04-13 (×2): 325 mg via ORAL
  Filled 2015-04-11 (×2): qty 1

## 2015-04-11 MED ORDER — ROCURONIUM BROMIDE 100 MG/10ML IV SOLN
INTRAVENOUS | Status: DC | PRN
  Administered 2015-04-11: 10 mg via INTRAVENOUS
  Administered 2015-04-11: 30 mg via INTRAVENOUS

## 2015-04-11 MED ORDER — POLYETHYLENE GLYCOL 3350 17 G PO PACK
17.0000 g | PACK | Freq: Every day | ORAL | Status: DC | PRN
Start: 2015-04-11 — End: 2015-04-13

## 2015-04-11 MED ORDER — ONDANSETRON HCL 4 MG/2ML IJ SOLN
INTRAMUSCULAR | Status: DC | PRN
  Administered 2015-04-11: 4 mg via INTRAVENOUS

## 2015-04-11 MED ORDER — ENOXAPARIN SODIUM 30 MG/0.3ML ~~LOC~~ SOLN
30.0000 mg | SUBCUTANEOUS | Status: DC
  Administered 2015-04-12 – 2015-04-13 (×2): 30 mg via SUBCUTANEOUS
  Filled 2015-04-11 (×2): qty 0.3

## 2015-04-11 MED ORDER — ONDANSETRON HCL 4 MG/2ML IJ SOLN
4.0000 mg | Freq: Once | INTRAMUSCULAR | Status: AC
  Administered 2015-04-11: 4 mg via INTRAVENOUS

## 2015-04-11 MED ORDER — DEXAMETHASONE SODIUM PHOSPHATE 4 MG/ML IJ SOLN
INTRAMUSCULAR | Status: AC
  Filled 2015-04-11: qty 2

## 2015-04-11 MED ORDER — CHLORHEXIDINE GLUCONATE 4 % EX LIQD
60.0000 mL | Freq: Once | CUTANEOUS | Status: AC
  Administered 2015-04-11: 4 via TOPICAL

## 2015-04-11 MED ORDER — PROPOFOL 10 MG/ML IV BOLUS
INTRAVENOUS | Status: AC
  Filled 2015-04-11: qty 20

## 2015-04-11 MED ORDER — 0.9 % SODIUM CHLORIDE (POUR BTL) OPTIME
TOPICAL | Status: DC | PRN
  Administered 2015-04-11 (×2): 1000 mL

## 2015-04-11 MED ORDER — ACETAMINOPHEN 10 MG/ML IV SOLN
1000.0000 mg | Freq: Once | INTRAVENOUS | Status: AC
  Administered 2015-04-11: 1000 mg via INTRAVENOUS

## 2015-04-11 MED ORDER — POTASSIUM CHLORIDE 10 MEQ/100ML IV SOLN: 10.0000 meq | INTRAVENOUS | Status: AC

## 2015-04-11 MED ORDER — LIDOCAINE HCL (CARDIAC) 20 MG/ML IV SOLN
INTRAVENOUS | Status: DC | PRN
  Administered 2015-04-11: 50 mg via INTRAVENOUS
  Administered 2015-04-11: 25 mg via INTRAVENOUS

## 2015-04-11 MED ORDER — MAGNESIUM CITRATE PO SOLN: 1.0000 | Freq: Once | ORAL | Status: AC | PRN

## 2015-04-11 MED ORDER — DEXAMETHASONE SODIUM PHOSPHATE 4 MG/ML IJ SOLN
INTRAMUSCULAR | Status: DC | PRN
  Administered 2015-04-11: 8 mg via INTRAVENOUS

## 2015-04-11 MED ORDER — METOCLOPRAMIDE HCL 10 MG PO TABS: 5.0000 mg | ORAL_TABLET | Freq: Three times a day (TID) | ORAL | Status: DC | PRN

## 2015-04-11 MED ORDER — ROCURONIUM BROMIDE 50 MG/5ML IV SOLN
INTRAVENOUS | Status: AC
Start: 2015-04-11 — End: 2015-04-11
  Filled 2015-04-11: qty 1

## 2015-04-11 MED ORDER — LIDOCAINE HCL (PF) 1 % IJ SOLN
INTRAMUSCULAR | Status: AC
  Filled 2015-04-11: qty 5

## 2015-04-11 MED ORDER — FENTANYL CITRATE (PF) 250 MCG/5ML IJ SOLN
INTRAMUSCULAR | Status: AC
  Filled 2015-04-11: qty 5

## 2015-04-11 MED ORDER — FENTANYL CITRATE (PF) 100 MCG/2ML IJ SOLN
INTRAMUSCULAR | Status: AC
  Filled 2015-04-11: qty 2

## 2015-04-11 MED ORDER — SODIUM CHLORIDE 0.9 % IJ SOLN
INTRAMUSCULAR | Status: AC
  Filled 2015-04-11: qty 10

## 2015-04-11 MED ORDER — BUPIVACAINE-EPINEPHRINE (PF) 0.5% -1:200000 IJ SOLN
INTRAMUSCULAR | Status: AC
  Filled 2015-04-11: qty 60

## 2015-04-11 MED ORDER — PHENYLEPHRINE HCL 10 MG/ML IJ SOLN
INTRAMUSCULAR | Status: AC
  Filled 2015-04-11: qty 1

## 2015-04-11 MED ORDER — BISACODYL 10 MG RE SUPP: 10.0000 mg | Freq: Every day | RECTAL | Status: DC | PRN

## 2015-04-11 MED ORDER — ACETAMINOPHEN 500 MG PO TABS
1000.0000 mg | ORAL_TABLET | Freq: Four times a day (QID) | ORAL | Status: AC
  Administered 2015-04-11 (×2): 1000 mg via ORAL
  Filled 2015-04-11 (×2): qty 2

## 2015-04-11 MED ORDER — KETOROLAC TROMETHAMINE 30 MG/ML IJ SOLN
30.0000 mg | Freq: Four times a day (QID) | INTRAMUSCULAR | Status: DC
  Administered 2015-04-11 – 2015-04-13 (×7): 30 mg via INTRAVENOUS
  Filled 2015-04-11 (×7): qty 1

## 2015-04-11 MED ORDER — METOCLOPRAMIDE HCL 5 MG/ML IJ SOLN: 5.0000 mg | Freq: Three times a day (TID) | INTRAMUSCULAR | Status: DC | PRN

## 2015-04-11 MED ORDER — VANCOMYCIN HCL IN DEXTROSE 1-5 GM/200ML-% IV SOLN
INTRAVENOUS | Status: AC
  Filled 2015-04-11: qty 200

## 2015-04-11 MED ORDER — ACETAMINOPHEN 10 MG/ML IV SOLN
INTRAVENOUS | Status: AC
  Filled 2015-04-11: qty 100

## 2015-04-11 MED ORDER — TRIAMTERENE-HCTZ 75-50 MG PO TABS
1.0000 | ORAL_TABLET | Freq: Every day | ORAL | Status: DC
  Administered 2015-04-12 – 2015-04-13 (×2): 1 via ORAL
  Filled 2015-04-11 (×2): qty 1

## 2015-04-11 MED ORDER — MENTHOL 3 MG MT LOZG: 1.0000 | LOZENGE | OROMUCOSAL | Status: DC | PRN

## 2015-04-11 MED ORDER — OXYCODONE HCL 5 MG PO TABS: 5.0000 mg | ORAL_TABLET | Freq: Once | ORAL | Status: DC

## 2015-04-11 MED ORDER — VANCOMYCIN HCL IN DEXTROSE 1-5 GM/200ML-% IV SOLN
1000.0000 mg | Freq: Two times a day (BID) | INTRAVENOUS | Status: AC
  Administered 2015-04-12: 1000 mg via INTRAVENOUS
  Filled 2015-04-11: qty 200

## 2015-04-11 MED ORDER — SUCCINYLCHOLINE CHLORIDE 20 MG/ML IJ SOLN
INTRAMUSCULAR | Status: DC | PRN
  Administered 2015-04-11: 120 mg via INTRAVENOUS

## 2015-04-11 MED ORDER — HYDROCODONE-ACETAMINOPHEN 5-325 MG PO TABS
1.0000 | ORAL_TABLET | ORAL | Status: DC
  Administered 2015-04-11 – 2015-04-12 (×3): 1 via ORAL
  Filled 2015-04-11 (×5): qty 1

## 2015-04-11 MED ORDER — NEOSTIGMINE METHYLSULFATE 10 MG/10ML IV SOLN
INTRAVENOUS | Status: DC | PRN
  Administered 2015-04-11: 4 mg via INTRAVENOUS

## 2015-04-11 MED ORDER — MIDAZOLAM HCL 2 MG/2ML IJ SOLN
INTRAMUSCULAR | Status: AC
  Filled 2015-04-11: qty 2

## 2015-04-11 MED ORDER — FENTANYL CITRATE (PF) 100 MCG/2ML IJ SOLN
INTRAMUSCULAR | Status: DC | PRN
  Administered 2015-04-11 (×2): 50 ug via INTRAVENOUS
  Administered 2015-04-11: 25 ug via INTRAVENOUS
  Administered 2015-04-11 (×3): 50 ug via INTRAVENOUS

## 2015-04-11 MED ORDER — ONDANSETRON HCL 4 MG/2ML IJ SOLN
INTRAMUSCULAR | Status: AC
  Filled 2015-04-11: qty 2

## 2015-04-11 MED ORDER — NEOSTIGMINE METHYLSULFATE 10 MG/10ML IV SOLN
INTRAVENOUS | Status: AC
  Filled 2015-04-11: qty 1

## 2015-04-11 MED ORDER — POTASSIUM CHLORIDE 10 MEQ/100ML IV SOLN
10.0000 meq | INTRAVENOUS | Status: AC
  Administered 2015-04-11 (×2): 10 meq via INTRAVENOUS
  Filled 2015-04-11 (×3): qty 100

## 2015-04-11 MED ORDER — GLYCOPYRROLATE 0.2 MG/ML IJ SOLN
INTRAMUSCULAR | Status: DC | PRN
  Administered 2015-04-11: 0.2 mg via INTRAVENOUS
  Administered 2015-04-11: 0.6 mg via INTRAVENOUS

## 2015-04-11 MED ORDER — SODIUM CHLORIDE 0.9 % IV SOLN
INTRAVENOUS | Status: DC
  Administered 2015-04-11 – 2015-04-12 (×2): via INTRAVENOUS

## 2015-04-11 MED ORDER — MORPHINE SULFATE 2 MG/ML IJ SOLN: 0.5000 mg | INTRAMUSCULAR | Status: DC | PRN

## 2015-04-11 MED ORDER — SUCCINYLCHOLINE CHLORIDE 20 MG/ML IJ SOLN
INTRAMUSCULAR | Status: AC
  Filled 2015-04-11: qty 1

## 2015-04-11 MED ORDER — ALUM & MAG HYDROXIDE-SIMETH 200-200-20 MG/5ML PO SUSP
30.0000 mL | ORAL | Status: DC | PRN
Start: 2015-04-11 — End: 2015-04-13

## 2015-04-11 MED ORDER — LACTATED RINGERS IV SOLN
INTRAVENOUS | Status: DC | PRN
  Administered 2015-04-11 (×2): via INTRAVENOUS

## 2015-04-11 MED ORDER — ACETAMINOPHEN 650 MG RE SUPP: 650.0000 mg | Freq: Four times a day (QID) | RECTAL | Status: DC | PRN

## 2015-04-11 MED ORDER — BUPIVACAINE-EPINEPHRINE (PF) 0.5% -1:200000 IJ SOLN
INTRAMUSCULAR | Status: DC | PRN
  Administered 2015-04-11: 60 mL

## 2015-04-11 MED ORDER — EPHEDRINE SULFATE 50 MG/ML IJ SOLN
INTRAMUSCULAR | Status: AC
  Filled 2015-04-11: qty 1

## 2015-04-11 MED ORDER — DEXTROSE 5 % IV SOLN
INTRAVENOUS | Status: DC | PRN
  Administered 2015-04-11: 12:00:00 via INTRAVENOUS

## 2015-04-11 MED ORDER — PHENOL 1.4 % MT LIQD
1.0000 | OROMUCOSAL | Status: DC | PRN
Start: 2015-04-11 — End: 2015-04-13

## 2015-04-11 MED ORDER — SODIUM CHLORIDE 0.9 % IJ SOLN
INTRAMUSCULAR | Status: AC
  Filled 2015-04-11: qty 30

## 2015-04-11 MED ORDER — MUPIROCIN 2 % EX OINT
1.0000 "application " | TOPICAL_OINTMENT | Freq: Two times a day (BID) | CUTANEOUS | Status: DC
  Administered 2015-04-11 – 2015-04-13 (×4): 1 via NASAL
  Filled 2015-04-11: qty 22

## 2015-04-11 MED ORDER — PROPOFOL 10 MG/ML IV BOLUS
INTRAVENOUS | Status: DC | PRN
  Administered 2015-04-11: 20 mg via INTRAVENOUS
  Administered 2015-04-11: 120 mg via INTRAVENOUS

## 2015-04-11 MED ORDER — ACETAMINOPHEN 325 MG PO TABS: 650.0000 mg | ORAL_TABLET | Freq: Four times a day (QID) | ORAL | Status: DC | PRN

## 2015-04-11 MED ORDER — ONDANSETRON HCL 4 MG/2ML IJ SOLN: 4.0000 mg | Freq: Four times a day (QID) | INTRAMUSCULAR | Status: DC | PRN

## 2015-04-11 MED ORDER — ONDANSETRON HCL 4 MG PO TABS
4.0000 mg | ORAL_TABLET | Freq: Four times a day (QID) | ORAL | Status: DC | PRN
  Administered 2015-04-13: 4 mg via ORAL
  Filled 2015-04-11: qty 1

## 2015-04-11 MED ORDER — VANCOMYCIN HCL IN DEXTROSE 1-5 GM/200ML-% IV SOLN
1000.0000 mg | INTRAVENOUS | Status: AC
  Administered 2015-04-11: 1000 mg via INTRAVENOUS
  Filled 2015-04-11: qty 200

## 2015-04-11 MED ORDER — ARTIFICIAL TEARS OP OINT
TOPICAL_OINTMENT | OPHTHALMIC | Status: DC | PRN
Start: 2015-04-11 — End: 2015-04-11
  Administered 2015-04-11: 1 via OPHTHALMIC

## 2015-04-11 MED ORDER — DOCUSATE SODIUM 100 MG PO CAPS
100.0000 mg | ORAL_CAPSULE | Freq: Two times a day (BID) | ORAL | Status: DC
  Administered 2015-04-11 – 2015-04-13 (×4): 100 mg via ORAL
  Filled 2015-04-11 (×4): qty 1

## 2015-04-11 MED ORDER — GLYCOPYRROLATE 0.2 MG/ML IJ SOLN
INTRAMUSCULAR | Status: AC
  Filled 2015-04-11: qty 4

## 2015-04-11 MED ORDER — CHLORHEXIDINE GLUCONATE CLOTH 2 % EX PADS
6.0000 | MEDICATED_PAD | Freq: Every day | CUTANEOUS | Status: DC
  Administered 2015-04-11 – 2015-04-13 (×3): 6 via TOPICAL

## 2015-04-11 SURGICAL SUPPLY — 75 items
BAG HAMPER (MISCELLANEOUS) ×3 IMPLANT
BANDAGE ELASTIC 3 VELCRO NS (GAUZE/BANDAGES/DRESSINGS) ×3 IMPLANT
BANDAGE ELASTIC 4 VELCRO NS (GAUZE/BANDAGES/DRESSINGS) ×6 IMPLANT
BANDAGE ESMARK 4X12 BL STRL LF (DISPOSABLE) ×1 IMPLANT
BIT DRILL 2.5X110 QC LCP DISP (BIT) IMPLANT
BIT DRILL 2.8 (BIT) ×1
BIT DRILL CANN 2.7 (BIT) ×2
BIT DRILL CANN 2.7MM (BIT) ×1
BIT DRILL CANN QC 2.8X165 (BIT) IMPLANT
BIT DRILL QC 3.5X110 (BIT) IMPLANT
BIT DRILL SRG 2.7XCANN AO CPLG (BIT) IMPLANT
BIT DRL SRG 2.7XCANN AO CPLNG (BIT) ×1
BLADE 10 SAFETY STRL DISP (BLADE) ×1 IMPLANT
BNDG CMPR 12X4 ELC STRL LF (DISPOSABLE) ×1
BNDG COHESIVE 4X5 TAN STRL (GAUZE/BANDAGES/DRESSINGS) ×3 IMPLANT
BNDG ESMARK 4X12 BLUE STRL LF (DISPOSABLE) ×3
CHLORAPREP W/TINT 26ML (MISCELLANEOUS) ×6 IMPLANT
CLOTH BEACON ORANGE TIMEOUT ST (SAFETY) ×3 IMPLANT
COVER LIGHT HANDLE STERIS (MISCELLANEOUS) ×12 IMPLANT
CUFF TOURNIQUET SINGLE 34IN LL (TOURNIQUET CUFF) ×3 IMPLANT
CUFF TOURNIQUET SINGLE 44IN (TOURNIQUET CUFF) IMPLANT
DRAPE C-ARM FOLDED MOBILE STRL (DRAPES) ×3 IMPLANT
DRAPE PROXIMA HALF (DRAPES) ×3 IMPLANT
DRILL BIT 2.8MM (BIT) ×3
EVACUATOR SPRING W/O DRAIN (MISCELLANEOUS) ×2 IMPLANT
GAUZE SPONGE 4X4 12PLY STRL (GAUZE/BANDAGES/DRESSINGS) ×1 IMPLANT
GAUZE XEROFORM 5X9 LF (GAUZE/BANDAGES/DRESSINGS) ×3 IMPLANT
GLOVE BIO SURGEON STRL SZ7.5 (GLOVE) ×2 IMPLANT
GLOVE BIOGEL PI IND STRL 7.0 (GLOVE) IMPLANT
GLOVE BIOGEL PI IND STRL 7.5 (GLOVE) IMPLANT
GLOVE BIOGEL PI INDICATOR 7.0 (GLOVE) ×6
GLOVE BIOGEL PI INDICATOR 7.5 (GLOVE) ×6
GLOVE SKINSENSE NS SZ8.0 LF (GLOVE) ×2
GLOVE SKINSENSE STRL SZ8.0 LF (GLOVE) ×1 IMPLANT
GLOVE SS N UNI LF 8.5 STRL (GLOVE) ×3 IMPLANT
GOWN STRL REUS W/TWL LRG LVL3 (GOWN DISPOSABLE) ×9 IMPLANT
GOWN STRL REUS W/TWL XL LVL3 (GOWN DISPOSABLE) ×3 IMPLANT
INST SET MINOR BONE (KITS) ×3 IMPLANT
K-WIRE 1.4X100 (WIRE) ×3
K-WIRE 1.6X150 (WIRE)
K-WIRE 2.0X150M (WIRE)
KIT ROOM TURNOVER APOR (KITS) ×3 IMPLANT
KWIRE 1.4X100 (WIRE) IMPLANT
KWIRE 1.6X150 (WIRE) IMPLANT
KWIRE 2.0X150M (WIRE) IMPLANT
MANIFOLD NEPTUNE II (INSTRUMENTS) ×3 IMPLANT
NDL HYPO 21X1.5 SAFETY (NEEDLE) ×1 IMPLANT
NEEDLE HYPO 21X1.5 SAFETY (NEEDLE) ×3 IMPLANT
NS IRRIG 1000ML POUR BTL (IV SOLUTION) ×5 IMPLANT
PACK BASIC LIMB (CUSTOM PROCEDURE TRAY) ×3 IMPLANT
PAD ABD 5X9 TENDERSORB (GAUZE/BANDAGES/DRESSINGS) ×6 IMPLANT
PAD ARMBOARD 7.5X6 YLW CONV (MISCELLANEOUS) ×3 IMPLANT
PAD CAST 4YDX4 CTTN HI CHSV (CAST SUPPLIES) ×1 IMPLANT
PADDING CAST COTTON 4X4 STRL (CAST SUPPLIES)
PADDING WEBRIL 4 STERILE (GAUZE/BANDAGES/DRESSINGS) ×2 IMPLANT
PLATE 6H 113MM (Plate) ×2 IMPLANT
SCREW 46X4.0MM (Screw) ×4 IMPLANT
SCREW BONE 14MMX3.5MM (Screw) ×4 IMPLANT
SCREW BONE 18 (Screw) ×4 IMPLANT
SCREW BONE 3.5X20MM (Screw) ×2 IMPLANT
SCREW BONE NON-LCKING 3.5X12MM (Screw) ×4 IMPLANT
SET BASIN LINEN APH (SET/KITS/TRAYS/PACK) ×3 IMPLANT
SPLINT IMMOBILIZER J 3INX20FT (CAST SUPPLIES)
SPLINT J IMMOBILIZER 3X20FT (CAST SUPPLIES) IMPLANT
SPLINT J IMMOBILIZER 4X20FT (CAST SUPPLIES) ×1 IMPLANT
SPLINT J PLASTER J 4INX20Y (CAST SUPPLIES) ×2
SPONGE GAUZE 4X4 12PLY (GAUZE/BANDAGES/DRESSINGS) ×2 IMPLANT
SPONGE LAP 18X18 X RAY DECT (DISPOSABLE) ×5 IMPLANT
STAPLER VISISTAT 35W (STAPLE) ×3 IMPLANT
SUT ETHILON 3 0 FSL (SUTURE) ×2 IMPLANT
SUT MON AB 0 CT1 (SUTURE) ×3 IMPLANT
SUT MON AB 2-0 CT1 36 (SUTURE) ×2 IMPLANT
SYR 30ML LL (SYRINGE) ×3 IMPLANT
SYR BULB IRRIGATION 50ML (SYRINGE) ×3 IMPLANT
TRAY FOLEY CATH SILVER 16FR (SET/KITS/TRAYS/PACK) ×2 IMPLANT

## 2015-04-11 NOTE — Progress Notes (Signed)
Pt SpO2 % has been 97-99% on 3L.  Turned O2 to 1L, SpO2, now 99%.  Will continue to monitor.

## 2015-04-11 NOTE — Anesthesia Procedure Notes (Signed)
Procedure Name: Intubation Date/Time: 04/11/2015 12:30 PM Performed by: Pernell DupreADAMS, Sheridan Gettel A Pre-anesthesia Checklist: Timeout performed, Patient identified, Emergency Drugs available, Suction available and Patient being monitored Patient Re-evaluated:Patient Re-evaluated prior to inductionOxygen Delivery Method: Circle System Utilized Preoxygenation: Pre-oxygenation with 100% oxygen Intubation Type: IV induction, Rapid sequence and Cricoid Pressure applied Ventilation: Mask ventilation without difficulty Laryngoscope Size: 3 and Miller Grade View: Grade I Tube type: Oral Tube size: 7.0 mm Number of attempts: 1 Airway Equipment and Method: Stylet Placement Confirmation: ETT inserted through vocal cords under direct vision,  positive ETCO2 and breath sounds checked- equal and bilateral Secured at: 21 cm Tube secured with: Tape Dental Injury: Teeth and Oropharynx as per pre-operative assessment

## 2015-04-11 NOTE — Progress Notes (Signed)
The patient is receiving acetaminophen by the intravenous route.  Based on criteria approved by the Pharmacy and Therapeutics Committee and the Medical Executive Committee, the medication is being converted to the equivalent oral dose form.  These criteria include: -No Active GI bleeding -Able to tolerate diet of full liquids (or better) or tube feeding OR able to tolerate other medications by the oral or enteral route  If you have any questions about this conversion, please contact the Pharmacy Department (ext [phone]).  Thank you.  Elson ClanLilliston, Venessa Wickham Michelle, [facility] HospitalRPH 04/11/2015 2:49 PM

## 2015-04-11 NOTE — Op Note (Signed)
Surgery dictation  The patient was identified in the preop area the left foot and ankle were marked as a surgical site. This was confirmed. Chart review was completed. 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  She had general anesthesia. Her left leg was prepped and draped sterilely. To 5 pound bumper placed longitudinally under the hip and right leg. Tourniquet was applied to the left eye.  The limb was exsanguinated with a 4 to Esmarch tourniquet was elevated 20 mmHg. A stable 71 minutes.  A lateral incision was made over the fibula is was taken down to bone. A full-thickness skin flap was established blunt and sharp dissection was carried down to the fracture site which was identified and clamped with a pointed reduction clamp. A lag screw was placed anterior to posterior with 3.5 mm screw. We then took a multihole lateral distal fibular plate and applied it to the fibula using all non-locking screws. Starting with the distal fragment and then screw was placed in the proximal fragment. X-ray confirmed reduction and hardware position.  We then completed all screws except for the top one which was not necessary as we had 4 screws above the fracture site.  We then turned our attention medially. We made a incision over the medial malleolus centered over the fracture. We used a pointed reduction clamp to reduce the fracture. We looked over at the medial joint space irrigated and cleared about prior to reduction and then pinned it in place. X-rays show fracture reduction mortise restored. We drilled with a 2.5 drill bit and placed a 4.0 screw in the medial malleolus. We removed the pin and then place a second screw. X-rays confirmed reduction and mortise restoration.  We irrigated both wounds thoroughly. We then closed the medial side with 2-0 Monocryl and 3-0 nylon interrupted sutures. We closed the lateral side with a running 0 Monocryl suture  and staples.  We gave 60 mL of Marcaine divided between the wound. We placed a drain prior to closure on the lateral side. We placed a splint over the sterile dressings. We then placed a Cryo/Cuff on the foot.  The patient was expanded taken recovery room in stable condition  Nonweightbearing for 6 weeks  I plan to do a dressing change and splint change in 48-72 hours

## 2015-04-11 NOTE — Brief Op Note (Signed)
04/10/2015 - 04/11/2015  2:32 PM  PATIENT:  [name]  64 y.o. female  PRE-OPERATIVE DIAGNOSIS:  bimalleolar fracture left ankle  POST-OPERATIVE DIAGNOSIS:  bimalleolar fracture left ankle  PROCEDURE:  Procedure(s): OPEN REDUCTION INTERNAL FIXATION LEFT ANKLE (Left)  Stryker ankle fixation Medial 2 screws 4.0 and lateral plate and screws + 1 lagscrew SURGEON:  Surgeon(s) and Role:    * Vickki HearingStanley E Harrison, MD - Primary  PHYSICIAN ASSISTANT:   ASSISTANTS: none   ANESTHESIA:   general  EBL:  Total I/O In: 1200 [I.V.:1200] Out: 1020 [Urine:1000; Blood:20]  BLOOD ADMINISTERED:none  DRAINS: (1) Hemovact drain(s) in the soft tissue with  Suction Open   LOCAL MEDICATIONS USED:  MARCAINE     SPECIMEN:  No Specimen  DISPOSITION OF SPECIMEN:  N/A  COUNTS:  YES  TOURNIQUET:   Total Tourniquet Time Documented: Thigh (Left) - 71 minutes Total: Thigh (Left) - 71 minutes   DICTATION: .Reubin Milanragon Dictation  PLAN OF CARE: Admit to inpatient   PATIENT DISPOSITION:  PACU - hemodynamically stable.   Delay start of Pharmacological VTE agent (>24hrs) due to surgical blood loss or risk of bleeding: no

## 2015-04-11 NOTE — Transfer of Care (Signed)
Immediate Anesthesia Transfer of Care Note  Patient: [name]  Procedure(s) Performed: Procedure(s): OPEN REDUCTION INTERNAL FIXATION LEFT ANKLE (Left)  Patient Location: PACU  Anesthesia Type:General  Level of Consciousness: awake, alert , oriented and patient cooperative  Airway & Oxygen Therapy: Patient Spontanous Breathing and Patient connected to face mask oxygen  Post-op Assessment: Report given to RN and Post -op Vital signs reviewed and stable  Post vital signs: Reviewed and stable  Last Vitals:  Filed Vitals:   04/11/15 1435  BP:   Pulse:   Temp: 36.9 C  Resp:     Complications: No apparent anesthesia complications

## 2015-04-11 NOTE — Progress Notes (Signed)
Utilization review Completed Giancarlo Askren RN BSN   

## 2015-04-11 NOTE — H&P (Signed)
[name] is an 64 y.o. female.   Chief Complaint: Left ankle pain HPI: 64 year old female was in her yard and the dogs her son came over here and she fell. She complains of severe left ankle pain and deformity. She describes throbbing aching pain. She had a modified closed reduction in the ER was placed in a splint admitted for surgery today. She is hypokalemic.  History reviewed. No pertinent past medical history.  Past Surgical History  Procedure Laterality Date  . Knee athroscopy both knees    . Total abdominal hysterectomy    . Hernia repair    . Total athroplasty rt knee  2009    No family history on file. Social History:  reports that she has never smoked. She does not have any smokeless tobacco history on file. She reports that she does not drink alcohol or use illicit drugs.  Allergies:  Allergies  Allergen Reactions  . Erythromycin Nausea And Vomiting    Medications Prior to Admission  Medication Sig Dispense Refill  . butalbital-acetaminophen-caffeine (FIORICET, ESGIC) 50-325-40 MG per tablet Take 1-2 tablets by mouth every 8 (eight) hours as needed. For migraine pain relief  3  . citalopram (CELEXA) 20 MG tablet Take 20 mg by mouth daily.     . potassium chloride (MICRO-K) 10 MEQ CR capsule Take 2 capsules by mouth 2 (two) times daily.  3  . simvastatin (ZOCOR) 40 MG tablet Take 40 mg by mouth at bedtime.     . triamterene-hydrochlorothiazide (DYAZIDE) 37.5-25 MG per capsule Take 2 capsules by mouth daily.  3  . zolpidem (AMBIEN) 10 MG tablet Take 10 mg by mouth at bedtime as needed for sleep.     Marland Kitchen gabapentin (NEURONTIN) 100 MG capsule Take 1 capsule (100 mg total) by mouth at bedtime. (Patient not taking: Reported on 04/10/2015) 90 capsule 2  . POTASSIUM PO Take by mouth.        Results for orders placed or performed during the hospital encounter of 04/10/15 (from the past 48 hour(s))  CBC     Status: Abnormal   Collection Time: 04/10/15  9:00 PM  Result Value Ref  Range   WBC 16.0 (H) 4.0 - 10.5 K/uL   RBC 4.61 3.87 - 5.11 MIL/uL   Hemoglobin 13.5 12.0 - 15.0 g/dL   HCT 40.1 36.0 - 46.0 %   MCV 87.0 78.0 - 100.0 fL   MCH 29.3 26.0 - 34.0 pg   MCHC 33.7 30.0 - 36.0 g/dL   RDW 12.5 11.5 - 15.5 %   Platelets 229 150 - 400 K/uL  Basic metabolic panel     Status: Abnormal   Collection Time: 04/10/15  9:00 PM  Result Value Ref Range   Sodium 138 135 - 145 mmol/L   Potassium 3.1 (L) 3.5 - 5.1 mmol/L   Chloride 100 (L) 101 - 111 mmol/L   CO2 26 22 - 32 mmol/L   Glucose, Bld 109 (H) 65 - 99 mg/dL   BUN 17 6 - 20 mg/dL   Creatinine, Ser 0.84 0.44 - 1.00 mg/dL   Calcium 9.3 8.9 - 10.3 mg/dL   GFR calc non Af Amer >60 >60 mL/min   GFR calc Af Amer >60 >60 mL/min    Comment: (NOTE) The eGFR has been calculated using the CKD EPI equation. This calculation has not been validated in all clinical situations. eGFR's persistently <60 mL/min signify possible Chronic Kidney Disease.    Anion gap 12 5 - 15  APTT     Status: Abnormal   Collection Time: 04/10/15  9:00 PM  Result Value Ref Range   aPTT 23 (L) 24 - 37 seconds  Protime-INR     Status: None   Collection Time: 04/10/15  9:00 PM  Result Value Ref Range   Prothrombin Time 13.1 11.6 - 15.2 seconds   INR 0.97 0.00 - 1.49  Type and screen     Status: None   Collection Time: 04/10/15  9:02 PM  Result Value Ref Range   ABO/RH(D) B POS    Antibody Screen NEG    Sample Expiration 04/13/2015   Urinalysis, Routine w reflex microscopic (not at [facility])     Status: None   Collection Time: 04/11/15  1:20 AM  Result Value Ref Range   Color, Urine YELLOW YELLOW   APPearance CLEAR CLEAR   Specific Gravity, Urine 1.015 1.005 - 1.030   pH 5.5 5.0 - 8.0   Glucose, UA NEGATIVE NEGATIVE mg/dL   Hgb urine dipstick NEGATIVE NEGATIVE   Bilirubin Urine NEGATIVE NEGATIVE   Ketones, ur NEGATIVE NEGATIVE mg/dL   Protein, ur NEGATIVE NEGATIVE mg/dL   Urobilinogen, UA 0.2 0.0 - 1.0 mg/dL   Nitrite NEGATIVE  NEGATIVE   Leukocytes, UA NEGATIVE NEGATIVE    Comment: MICROSCOPIC NOT DONE ON URINES WITH NEGATIVE PROTEIN, BLOOD, LEUKOCYTES, NITRITE, OR GLUCOSE <1000 mg/dL.  Surgical pcr screen     Status: Abnormal   Collection Time: 04/11/15  1:40 AM  Result Value Ref Range   MRSA, PCR POSITIVE (A) NEGATIVE    Comment: RESULT CALLED TO, READ BACK BY AND VERIFIED WITH:  LEWIS,D @ 0341 ON 04/11/15 BY WOODIE,J    Staphylococcus aureus POSITIVE (A) NEGATIVE    Comment:        The Xpert SA Assay (FDA approved for NASAL specimens in patients over 55 years of age), is one component of a comprehensive surveillance program.  Test performance has been validated by [facility] for patients greater than or equal to 12 year old. It is not intended to diagnose infection nor to guide or monitor treatment. RESULT CALLED TO, READ BACK BY AND VERIFIED WITH:  LEWIS,D @ 0341 ON 04/11/15 BY WOODIE,J   Basic metabolic panel     Status: Abnormal   Collection Time: 04/11/15  6:06 AM  Result Value Ref Range   Sodium 137 135 - 145 mmol/L   Potassium 3.0 (L) 3.5 - 5.1 mmol/L   Chloride 101 101 - 111 mmol/L   CO2 27 22 - 32 mmol/L   Glucose, Bld 108 (H) 65 - 99 mg/dL   BUN 12 6 - 20 mg/dL   Creatinine, Ser 0.70 0.44 - 1.00 mg/dL   Calcium 8.6 (L) 8.9 - 10.3 mg/dL   GFR calc non Af Amer >60 >60 mL/min   GFR calc Af Amer >60 >60 mL/min    Comment: (NOTE) The eGFR has been calculated using the CKD EPI equation. This calculation has not been validated in all clinical situations. eGFR's persistently <60 mL/min signify possible Chronic Kidney Disease.    Anion gap 9 5 - 15   Dg Chest 1 View  04/10/2015   CLINICAL DATA:  Preop cardiovascular exam  EXAM: CHEST  1 VIEW  COMPARISON:  None.  FINDINGS: Borderline cardiomegaly, with heart size accentuated by low volumes. Negative aortic and hilar contours. There is no edema, consolidation, effusion, or pneumothorax.  IMPRESSION: 1. Hypoventilation and borderline  cardiomegaly. 2. No acute findings.   Electronically Signed  By: Monte Fantasia M.D.   On: 04/10/2015 23:22   Dg Ankle Complete Left  04/10/2015   CLINICAL DATA:  Patient was tripped and fell down this evening. Pain and deformity of the left ankle.  EXAM: LEFT ANKLE COMPLETE - 3+ VIEW  COMPARISON:  None.  FINDINGS: There is transverse fracture across the base of the medial malleolus. There is an oblique fracture of the distal fibula across the metaphysis. The talus has some locks laterally, and is nearly dislocated laterally in relation to the tibia. The distal fracture components of the medial malleolus and lateral malleolus have displaced with the talus. There is no convincing posterior malleolar fracture.  There is diffuse surrounding soft tissue swelling.  IMPRESSION: Bimalleolar fracture of the left ankle with significant lateral displacement of the talus and distal fracture fragments in relation to the distal tibia.   Electronically Signed   By: Lajean Manes M.D.   On: 04/10/2015 20:35   Dg Ankle Left Port  04/10/2015   CLINICAL DATA:  Postreduction of ankle fracture  EXAM: PORTABLE LEFT ANKLE - 2 VIEW  COMPARISON:  Radiography from the same day at 8:16 p.m.  FINDINGS: Oblique distal fibular and transverse medial malleolus fractures are again demonstrated. The foot remains dislocated laterally. No talar impaction fracture. No new acute findings, although limited by overlapping splint.  IMPRESSION: Medial and lateral ankle fracture with unchanged lateral dislocation.   Electronically Signed   By: Monte Fantasia M.D.   On: 04/10/2015 23:24   Dg Foot Complete Left  04/10/2015   CLINICAL DATA:  Tripped over dog and fell. Deformity and pain at the left ankle. Initial encounter.  EXAM: LEFT FOOT - COMPLETE 3+ VIEW  COMPARISON:  None.  FINDINGS: There is a trimalleolar fracture/dislocation involving the left ankle, with lateral and posterior dislocation of the talus, and lateral displacement of the  medial and lateral malleoli. There is also shortening at the distal fibular fracture. The posterior malleolar fracture is somewhat rotated and mildly superiorly displaced.  No additional fractures are seen. Visualized joint spaces are otherwise grossly unremarkable. Plantar and posterior calcaneal spurs are seen. Soft tissue swelling is noted about the fracture sites.  IMPRESSION: Trimalleolar fracture/dislocation involving the left ankle, with lateral and posterior dislocation of the talus, and lateral displacement of the medial and lateral malleoli. Shortening also noted at the distal fibular fracture. Posterior malleolar fracture is somewhat rotated and mildly superiorly displaced.   Electronically Signed   By: Garald Balding M.D.   On: 04/10/2015 20:36    Review of Systems  All other systems reviewed and are negative.   Blood pressure 114/53, pulse 79, temperature 97.8 F (36.6 C), temperature source Oral, resp. rate 20, height _0  (1.626 m), weight 198 lb (89.812 kg), SpO2 97 %. Physical Exam  Constitutional: She is oriented to person, place, and time. She appears well-developed and well-nourished. No distress.  HENT:  Head: Normocephalic.  Eyes: Pupils are equal, round, and reactive to light.  Neck: Normal range of motion.  Cardiovascular: Normal rate.   Respiratory: Effort normal.  GI: Soft.  Musculoskeletal: Normal range of motion.  Her upper extremity is or normal, right lower extremity is normal, right knee replacement noted and knee functioning fine bilaterally  Separate exam for left ankle  Neurological: She is alert and oriented to person, place, and time. She has normal reflexes.  Skin: Skin is warm and dry. No rash noted. She is not diaphoretic. No erythema. No pallor.  Psychiatric: She  has a normal mood and affect. Her behavior is normal. Judgment and thought content normal.   left ankle foot the patient has tenderness and swelling on both medial and lateral ankles are  slight deformity neurovascular exam is intact remaining exam had to be deferred secondary to pain  Assessment/Plan Hypokalemia chronic Left ankle bimalleolar fracture needs internal fixation  Arther Abbott 04/11/2015, 10:27 AM

## 2015-04-11 NOTE — Anesthesia Preprocedure Evaluation (Signed)
Anesthesia Evaluation  Patient identified by MRN, date of birth, ID band Patient awake    Reviewed: Allergy & Precautions, H&P , NPO status , Patient's Chart, lab work & pertinent test results  History of Anesthesia Complications (+) PONV and history of anesthetic complications  Airway Mallampati: II  TM Distance: >3 FB Neck ROM: full    Dental  (+) Teeth Intact, Chipped Upper front teeth chipped:   Pulmonary neg pulmonary ROS,  breath sounds clear to auscultation  Pulmonary exam normal       Cardiovascular Exercise Tolerance: Good hypertension, On Medications Normal cardiovascular examRhythm:regular Rate:Normal     Neuro/Psych negative neurological ROS  negative psych ROS   GI/Hepatic Neg liver ROS, GERD-  ,Complains of "food getting stuck" on occasion   Endo/Other  negative endocrine ROS  Renal/GU negative Renal ROS  negative genitourinary   Musculoskeletal   Abdominal   Peds  Hematology negative hematology ROS (+)   Anesthesia Other Findings   Reproductive/Obstetrics negative OB ROS                             Anesthesia Physical Anesthesia Plan  ASA: II and emergent  Anesthesia Plan: General ETT, Rapid Sequence and Cricoid Pressure   Post-op Pain Management:    Induction:   Airway Management Planned:   Additional Equipment:   Intra-op Plan:   Post-operative Plan:   Informed Consent: I have reviewed the patients History and Physical, chart, labs and discussed the procedure including the risks, benefits and alternatives for the proposed anesthesia with the patient or authorized representative who has indicated his/her understanding and acceptance.   Dental Advisory Given  Plan Discussed with: Anesthesiologist and Surgeon  Anesthesia Plan Comments:         Anesthesia Quick Evaluation

## 2015-04-11 NOTE — Anesthesia Postprocedure Evaluation (Signed)
  Anesthesia Post-op Note  Patient: [name]  Procedure(s) Performed: Procedure(s): OPEN REDUCTION INTERNAL FIXATION LEFT ANKLE (Left)  Patient Location: PACU  Anesthesia Type:General  Level of Consciousness: awake, alert , oriented and patient cooperative  Airway and Oxygen Therapy: Patient Spontanous Breathing and Patient connected to face mask oxygen  Post-op Pain: none  Post-op Assessment: Post-op Vital signs reviewed, Patient's Cardiovascular Status Stable, Respiratory Function Stable, Patent Airway, No signs of Nausea or vomiting and Pain level controlled  Post-op Vital Signs: Reviewed and stable  Last Vitals:  Filed Vitals:   04/11/15 1435  BP: 134/61  Pulse: 84  Temp: 36.9 C  Resp: 18    Complications: No apparent anesthesia complications

## 2015-04-12 LAB — BASIC METABOLIC PANEL
ANION GAP: 7 (ref 5–15)
BUN: 10 mg/dL (ref 6–20)
CHLORIDE: 101 mmol/L (ref 101–111)
CO2: 29 mmol/L (ref 22–32)
Calcium: 8.6 mg/dL — ABNORMAL LOW (ref 8.9–10.3)
Creatinine, Ser: 0.66 mg/dL (ref 0.44–1.00)
GLUCOSE: 96 mg/dL (ref 65–99)
Potassium: 3.6 mmol/L (ref 3.5–5.1)
SODIUM: 137 mmol/L (ref 135–145)

## 2015-04-12 MED ORDER — HYDROCODONE-ACETAMINOPHEN 5-325 MG PO TABS
1.0000 | ORAL_TABLET | ORAL | Status: DC | PRN
  Administered 2015-04-12 – 2015-04-13 (×2): 1 via ORAL
  Filled 2015-04-12 (×2): qty 1

## 2015-04-12 MED ORDER — VANCOMYCIN HCL IN DEXTROSE 1-5 GM/200ML-% IV SOLN
INTRAVENOUS | Status: AC
  Filled 2015-04-12: qty 200

## 2015-04-12 MED ORDER — POTASSIUM CHLORIDE CRYS ER 20 MEQ PO TBCR
40.0000 meq | EXTENDED_RELEASE_TABLET | Freq: Two times a day (BID) | ORAL | Status: DC
  Administered 2015-04-12 – 2015-04-13 (×2): 40 meq via ORAL
  Filled 2015-04-12 (×2): qty 2

## 2015-04-12 NOTE — Evaluation (Signed)
Physical Therapy Evaluation Patient Details Name: [name] MRN: [id number] DOB: 1951-02-14 Today's Date: 04/12/2015   History of Present Illness  64 year old female was in her yard and the dogs her son came over here and she fell. She complains of severe left ankle pain and deformity. She describes throbbing aching pain. She had a modified closed reduction in the ER was placed in a splint admitted for surgery today. She is hypokalemic.  Clinical Impression  Patient very pleasant and willing to work with skilled PT services this morning; prior to session RN reported that patient had easily performed transfer to bedside commode NWB L LE with walker. Patient demonstrated very good functional mobility skills this morning with only intermittent cues from PT to maintain NWB status of L LE during transfers and gait, however during gait patient did appear to fatigue easily. The patient and her husband reside in a 3-level home with multiple sets of stairs with railings on only one side, however, do state that if necessary she will be able to stay on the first floor for awhile. Advised patient to speak to MD or RN regarding questions about her cast/splint and her ability to shower with the splint on. Patient and her husband are confident in their ability to return home upon discharge from this facility, and are agreeable to HHPT at this time. Patient left in bed with L ankle elevated and ice machine applied, call bell within reach, and all needs otherwise met. Upon exiting room encountered MD preparing to enter to speak with patient; reported patient's performance of mobility with NWB L LE and walker today to MD.     Follow Up Recommendations Home health PT    Equipment Recommendations  Standard walker;Crutches    Recommendations for Other Services OT consult     Precautions / Restrictions Precautions Precautions: Other (comment) Precaution Comments: NWB L ankle  Required Braces or Orthoses: Other  Brace/Splint Other Brace/Splint: splint on ankle  Restrictions Weight Bearing Restrictions: Yes LLE Weight Bearing: Non weight bearing Other Position/Activity Restrictions: ice and elevation of L ankle when in bed      Mobility  Bed Mobility Overal bed mobility: Needs Assistance Bed Mobility: Supine to Sit;Sit to Supine     Supine to sit: Supervision Sit to supine: Supervision      Transfers Overall transfer level: Needs assistance Equipment used: Standard walker Transfers: Sit to/from Stand Sit to Stand: Min guard         General transfer comment: NWB L LE   Ambulation/Gait Ambulation/Gait assistance: Min guard Ambulation Distance (Feet): 25 Feet Assistive device: Standard walker       General Gait Details: NWB L LE, swing to pattern with walker   Stairs            Wheelchair Mobility    Modified Rankin (Stroke Patients Only)       Balance Overall balance assessment: No apparent balance deficits (not formally assessed)                                           Pertinent Vitals/Pain Pain Assessment: 0-10 Pain Score: 1  Pain Descriptors / Indicators: Sore;Discomfort Pain Intervention(s): Premedicated before session;Monitored during session    Home Living Family/patient expects to be discharged to:: Private residence Living Arrangements: Spouse/significant other Available Help at Discharge: Family Type of Home: [place]  Layout: Multi-level;Able to live on main level with bedroom/bathroom Home Equipment: Gilford Rile - 2 wheels;Cane - single point;Shower seat      Prior Function Level of Independence: Independent               Hand Dominance        Extremity/Trunk Assessment   Upper Extremity Assessment: Defer to OT evaluation           Lower Extremity Assessment: Generalized weakness      Cervical / Trunk Assessment: Normal  Communication   Communication: No difficulties  Cognition  Arousal/Alertness: Awake/alert Behavior During Therapy: WFL for tasks assessed/performed Overall Cognitive Status: Within Functional Limits for tasks assessed                      General Comments General comments (skin integrity, edema, etc.): patient very pleasant with excellent mobility this morning; capillary refill of toes on L foot appear WNL, no color changes noted in toes at this time and no discomfort noted in toes/foot    Exercises        Assessment/Plan    PT Assessment Patient needs continued PT services  PT Diagnosis Difficulty walking;Abnormality of gait;Generalized weakness;Acute pain   PT Problem List Decreased strength;Decreased coordination;Pain;Decreased activity tolerance;Decreased knowledge of use of DME;Decreased balance;Decreased safety awareness;Decreased mobility;Decreased knowledge of precautions  PT Treatment Interventions DME instruction;Therapeutic exercise;Gait training;Balance training;Manual techniques;Stair training;Neuromuscular re-education;Functional mobility training;Therapeutic activities;Patient/family education   PT Goals (Current goals can be found in the Care Plan section) Acute Rehab PT Goals Patient Stated Goal: to return home  PT Goal Formulation: With patient/family Time For Goal Achievement: 04/26/15 Potential to Achieve Goals: Good    Frequency Min 5X/week   Barriers to discharge Other (comment) 3 level home but patient able to stay on first floor if necessary     Co-evaluation               End of Session Equipment Utilized During Treatment: Gait belt Activity Tolerance: Patient tolerated treatment well;No increased pain Patient left: in bed;with call bell/phone within reach;with family/visitor present;Other (comment) (in ice machine with L ankle elevated on pillow and foam; MD preparing to enter room ) Nurse Communication: Other (comment) (Piror to session RN reported that patient had just easlily performed transfer  to bedside commode; post-session PT also verbally reported to MD patient's mobility status/performance during PT session )         Time: 4932-4199 PT Time Calculation (min) (ACUTE ONLY): 23 min   Charges:   PT Evaluation $Initial PT Evaluation Tier I: 1 Procedure     PT G Codes:        Deniece Ree PT, DPT [phone]

## 2015-04-12 NOTE — Progress Notes (Signed)
[facility] this [facility].  Used walker, and standby assist of nurse and nurse tech.  NWB on left ft/ankle.  Voided 300 ML. Pt tolerated well back to bed.

## 2015-04-12 NOTE — Progress Notes (Signed)
[facility] since [facility] removed this AM. Used walker, and standby assist of nurse and nurse tech. NWB on left ft/ankle. Voided 400 ML. Pt tolerated well back to bed.

## 2015-04-12 NOTE — Progress Notes (Signed)
Foley catheter removed as order on POD 1. Patient tolerated removal well. Will continue to monitor patient status.

## 2015-04-12 NOTE — Anesthesia Postprocedure Evaluation (Signed)
  Anesthesia Post-op Note  Patient: [name]  Procedure(s) Performed: Procedure(s): OPEN REDUCTION INTERNAL FIXATION LEFT ANKLE (Left)  Patient Location: Room 308  Anesthesia Type:General  Level of Consciousness: awake, alert , oriented and patient cooperative  Airway and Oxygen Therapy: Patient Spontanous Breathing  Post-op Pain: mild  Post-op Assessment: Post-op Vital signs reviewed, Patient's Cardiovascular Status Stable, Respiratory Function Stable, Patent Airway, No signs of Nausea or vomiting and Pain level controlled  Post-op Vital Signs: Reviewed and stable  Last Vitals:  Filed Vitals:   04/12/15 1831  BP:   Pulse:   Temp: 37.4 C  Resp:     Complications: No apparent anesthesia complications

## 2015-04-12 NOTE — Addendum Note (Signed)
Addendum  created 04/12/15 2010 by Earleen NewportAmy A Adams, CRNA   Modules edited: Notes Section   Notes Section:  File: [id number]

## 2015-04-12 NOTE — Progress Notes (Signed)
Postop day #1 status post bimalleolar fracture left ankle  Pain is well controlled  Vitals are stable  Hypokalemia responded to oral medication and IV medication. 3.6 potassium today.  Patient was up and ambulatory nonweightbearing.  Plan for discharge in the morning.

## 2015-04-13 LAB — POTASSIUM: POTASSIUM: 3.9 mmol/L (ref 3.5–5.1)

## 2015-04-13 LAB — MAGNESIUM: Magnesium: 2 mg/dL (ref 1.7–2.4)

## 2015-04-13 MED ORDER — IBUPROFEN 800 MG PO TABS: 800.0000 mg | ORAL_TABLET | Freq: Three times a day (TID) | ORAL | Status: DC | PRN

## 2015-04-13 MED ORDER — GABAPENTIN 100 MG PO CAPS: 100.0000 mg | ORAL_CAPSULE | Freq: Three times a day (TID) | ORAL | Status: DC

## 2015-04-13 MED ORDER — HYDROCODONE-ACETAMINOPHEN 5-325 MG PO TABS: 1.0000 | ORAL_TABLET | Freq: Four times a day (QID) | ORAL | Status: DC | PRN

## 2015-04-13 NOTE — Discharge Summary (Signed)
Physician Discharge Summary  Patient ID: [name] MRN: [id number] DOB/AGE: 64/29/1952 64 y.o.  Admit date: 04/10/2015 Discharge date: 04/13/2015  Admission Diagnoses: Closed left bimalleolar ankle fracture, hypokalemia  Discharge Diagnoses: Closed left bimalleolar ankle fracture, hypokalemia Active Problems:   Ankle fracture, bimalleolar, closed   Bimalleolar ankle fracture   Discharged Condition: good  Hospital Course:  Hospital course. Had surgery on 04/10/2015 with a Stryker plate fixation laterally and 2 screws medially. She did well with physical therapy. We corrected her potassium deficiency. She ablated nonweightbearing and mobilize well in the room       Discharge Exam: Blood pressure 126/62, pulse 72, temperature 98.7 F (37.1 C), temperature source Oral, resp. rate 20, height 5\' 4"  (1.626 m), weight 198 lb (89.812 kg), SpO2 96 %. She is awake alert oriented moving well with her walker her foot looks fine in terms of capillary refill color no drainage. Minimal pain  Disposition: 06-Home-Health Care Svc  Discharge Instructions    Face-to-face encounter (required for Medicare/Medicaid patients)    Complete by:  As directed   I Fuller CanadaStanley Annarose Ouellet certify that this patient is under my care and that I, or a nurse practitioner or physician's assistant working with me, had a face-to-face encounter that meets the physician face-to-face encounter requirements with this patient on 04/13/2015. The encounter with the patient was in whole, or in part for the following medical condition(s) which is the primary reason for home health care (List medical condition): Internal fixation left bimalleolar fracture  The encounter with the patient was in whole, or in part, for the following medical condition, which is the primary reason for home health care:  Left ankle bimalleolar fracture status post open treatment internal fixation  I certify that, based on my findings, the following services  are medically necessary home health services:  Physical therapy  Reason for Medically Necessary Home Health Services:  Therapy- Investment banker, operationalGait Training, Patent examinerTransfer Training and Stair Training  My clinical findings support the need for the above services:  Unable to leave home safely without assistance and/or assistive device  Further, I certify that my clinical findings support that this patient is homebound due to:  Ambulates short distances less than 300 feet     For home use only DME 4 wheeled rolling walker with seat    Complete by:  As directed      Home Health    Complete by:  As directed   To provide the following care/treatments:  PT            Medication List    TAKE these medications        butalbital-acetaminophen-caffeine 50-325-40 MG per tablet  Commonly known as:  FIORICET, ESGIC  Take 1-2 tablets by mouth every 8 (eight) hours as needed. For migraine pain relief     citalopram 20 MG tablet  Commonly known as:  CELEXA  Take 20 mg by mouth daily.     gabapentin 100 MG capsule  Commonly known as:  NEURONTIN  Take 1 capsule (100 mg total) by mouth 3 (three) times daily.     HYDROcodone-acetaminophen 5-325 MG per tablet  Commonly known as:  NORCO  Take 1 tablet by mouth every 6 (six) hours as needed for moderate pain.     ibuprofen 800 MG tablet  Commonly known as:  ADVIL,MOTRIN  Take 1 tablet (800 mg total) by mouth every 8 (eight) hours as needed.     potassium chloride 10 MEQ CR capsule  Commonly  known as:  MICRO-K  Take 2 capsules by mouth 2 (two) times daily.     POTASSIUM PO  Take by mouth.     simvastatin 40 MG tablet  Commonly known as:  ZOCOR  Take 40 mg by mouth at bedtime.     triamterene-hydrochlorothiazide 37.5-25 MG per capsule  Commonly known as:  DYAZIDE  Take 2 capsules by mouth daily.     zolpidem 10 MG tablet  Commonly known as:  AMBIEN  Take 10 mg by mouth at bedtime as needed for sleep.           Follow-up Information    Follow up with  [facility].   Contact information:   [address] [place] [address] [phone]       Follow up with Fuller Canada, MD In 1 day.   Specialties:  Orthopedic Surgery, Radiology   Why:  Dressing change cast change   Contact information:   [address] [place] [address] [phone]       Signed: Fuller Canada 04/13/2015, 10:48 AM

## 2015-04-13 NOTE — Progress Notes (Signed)
Discharge instruction reviewed with patient. Hemovac intact and dressing intact lt ankle. No distress noted. Medicated for pain and nausea earlier. Prescription given to patient. Escorted to lobby via wheelchair.

## 2015-04-13 NOTE — Clinical Social Work Note (Signed)
CSW received referral for possible SNF. PT evaluated pt and recommendation is for home health. CSW will sign off, but can be reconsulted if needed.  Derenda FennelKara Alesha Jaffee, LCSW [phone]

## 2015-04-13 NOTE — Progress Notes (Signed)
OT Cancellation Note  Patient Details Name: [name] DapperMary P Almond MRN: [id number] DOB: [birth year]   Cancelled Treatment:    Reason Eval/Treat Not Completed: OT screened, no needs identified, will sign off. OT orders received. Pt's chart reviewed. Pt is walking at supervision level and able to maintain NWB status. MD recommend discharge home today. No OT needs at this time.  Limmie PatriciaLaura Marlos Carmen, OTR/L,CBIS  [phone]  04/13/2015, 8:52 AM

## 2015-04-13 NOTE — Care Management Note (Signed)
Case Management Note  Patient Details  Name: Shawnie DapperMary P Ignatowski MRN: [id number] Date of Birth: September 16, 1951  Expected Discharge Date:                  Expected Discharge Plan:  Home w Home Health Services  In-House Referral:  NA  Discharge planning Services  CM Consult  Post Acute Care Choice:  Home Health Choice offered to:  Patient  DME Arranged:    DME Agency:     HH Arranged:  PT HH Agency:  [facility]  Status of Service:  Completed, signed off  Medicare Important Message Given:    Date Medicare IM Given:    Medicare IM give by:    Date Additional Medicare IM Given:    Additional Medicare Important Message give by:     If discussed at Long Length of Stay Meetings, dates discussed:    Additional Comments: [facility]. Pt lives with husband and previously independent at baseline. Pt has cane, walker, bsc, crutches at home. [facility]. Christianne BorrowLinda Lothians, [facility] made aware of referral and will obtain pt info from chart. No further CM needs. Identified.   Malcolm Metrohildress, Emary Zalar Demske, RN 04/13/2015, 9:30 AM

## 2015-04-14 ENCOUNTER — Ambulatory Visit (INDEPENDENT_AMBULATORY_CARE_PROVIDER_SITE_OTHER): Payer: BLUE CROSS/BLUE SHIELD | Admitting: Orthopedic Surgery

## 2015-04-14 ENCOUNTER — Encounter: Payer: Self-pay | Admitting: Orthopedic Surgery

## 2015-04-14 VITALS — BP 105/66 | Ht 64.0 in | Wt 198.0 lb

## 2015-04-14 DIAGNOSIS — Z4789 Encounter for other orthopedic aftercare: Secondary | ICD-10-CM | POA: Diagnosis not present

## 2015-04-14 DIAGNOSIS — S82842D Displaced bimalleolar fracture of left lower leg, subsequent encounter for closed fracture with routine healing: Secondary | ICD-10-CM

## 2015-04-14 NOTE — Progress Notes (Signed)
Postoperative visit #1  Encounter Diagnoses  Name Primary?  Marland Kitchen. Aftercare following surgery of the musculoskeletal system Yes  . Bimalleolar ankle fracture, left, closed, with routine healing, subsequent encounter     Date of injury 04/10/2015 fall at home  Surgery May 28 open treatment internal fixation bimalleolar ankle fracture with Stryker plate screw construct  Today she came in for dressing change splint change removal of drain  Swelling minimal Wounds clean Red area over dorsum of the foot from dry dressing with pressure no opening of the skin small blister posterior to lateral incision  New splint applied with dressings and Xeroform  June 13 x-rays sutures out staples out application short leg nonweightbearing cast

## 2015-04-14 NOTE — Patient Instructions (Signed)
Return on June 13th  No weightbearing, elevate for swelling.

## 2015-04-15 ENCOUNTER — Telehealth: Payer: Self-pay | Admitting: Orthopedic Surgery

## 2015-04-15 NOTE — Telephone Encounter (Signed)
Yes   1 regular aspirin Daily for 28 days from surgery   Pt order yes   (not sure why hospital soc service didn't do it)

## 2015-04-15 NOTE — Telephone Encounter (Signed)
ROUTING TO DR HARRISON 

## 2015-04-15 NOTE — Telephone Encounter (Signed)
Nurse aware 

## 2015-04-15 NOTE — Telephone Encounter (Signed)
Call received from [facility] nurse, Dyanne IhaDebbie Dabbs states saw patient, and will need orders for the 1x per week for 3 weeks of home therapy.  Also has question regarding aspirin - states not on medication list, but per patient, Dr Romeo AppleHarrison wants her to take aspirin; what dosage?  Please advise.  Direct ph# [phone]

## 2015-04-16 ENCOUNTER — Encounter (HOSPITAL_COMMUNITY): Payer: Self-pay | Admitting: Orthopedic Surgery

## 2015-04-23 ENCOUNTER — Ambulatory Visit: Admitting: Orthopedic Surgery

## 2015-04-27 ENCOUNTER — Ambulatory Visit (INDEPENDENT_AMBULATORY_CARE_PROVIDER_SITE_OTHER): Payer: BLUE CROSS/BLUE SHIELD | Admitting: Orthopedic Surgery

## 2015-04-27 ENCOUNTER — Ambulatory Visit (INDEPENDENT_AMBULATORY_CARE_PROVIDER_SITE_OTHER): Payer: BLUE CROSS/BLUE SHIELD

## 2015-04-27 ENCOUNTER — Encounter: Payer: Self-pay | Admitting: Orthopedic Surgery

## 2015-04-27 VITALS — BP 132/81 | Ht 64.0 in | Wt 198.0 lb

## 2015-04-27 DIAGNOSIS — S82892A Other fracture of left lower leg, initial encounter for closed fracture: Secondary | ICD-10-CM

## 2015-04-27 DIAGNOSIS — S82842D Displaced bimalleolar fracture of left lower leg, subsequent encounter for closed fracture with routine healing: Secondary | ICD-10-CM | POA: Diagnosis not present

## 2015-04-27 NOTE — Patient Instructions (Signed)
Do not get the cast wet, if so call us immediately.

## 2015-04-27 NOTE — Progress Notes (Signed)
Chief Complaint  Patient presents with  . Follow-up    2 week follow up + xray left ankle fx, DOI 04/10/15    The patient's postop week to visit all sutures and staples were removed and she was placed in a short leg nonweightbearing cast. Her x-ray shows that the hardware is in good position and there are no problems related to that. I will see her in 4 weeks to change her cast to a brace as long as her x-rays look good.

## 2015-05-25 ENCOUNTER — Encounter: Payer: Self-pay | Admitting: Orthopedic Surgery

## 2015-05-25 ENCOUNTER — Ambulatory Visit (INDEPENDENT_AMBULATORY_CARE_PROVIDER_SITE_OTHER): Payer: Self-pay | Admitting: Orthopedic Surgery

## 2015-05-25 ENCOUNTER — Ambulatory Visit (INDEPENDENT_AMBULATORY_CARE_PROVIDER_SITE_OTHER): Payer: BLUE CROSS/BLUE SHIELD

## 2015-05-25 VITALS — BP 115/73 | Ht 64.0 in | Wt 198.0 lb

## 2015-05-25 DIAGNOSIS — S82842D Displaced bimalleolar fracture of left lower leg, subsequent encounter for closed fracture with routine healing: Secondary | ICD-10-CM

## 2015-05-25 DIAGNOSIS — S82892A Other fracture of left lower leg, initial encounter for closed fracture: Secondary | ICD-10-CM | POA: Diagnosis not present

## 2015-05-25 DIAGNOSIS — Z4789 Encounter for other orthopedic aftercare: Secondary | ICD-10-CM

## 2015-05-25 MED ORDER — HYDROCODONE-ACETAMINOPHEN 5-325 MG PO TABS: 1.0000 | ORAL_TABLET | Freq: Four times a day (QID) | ORAL | Status: DC | PRN

## 2015-05-25 NOTE — Progress Notes (Signed)
Patient ID: [name] DapperMary P Kelly, female   DOB: 1951-01-09, 64 y.o.   MRN: [id number]  Follow up visit  Chief Complaint  Patient presents with  . Follow-up    1 month follow up + xray oop Left ankle fx, DOI 04/10/15    BP 115/73 mmHg  Ht 5\' 4"  (1.626 m)  Wt 198 lb (89.812 kg)  BMI 33.97 kg/m2  Encounter Diagnoses  Name Primary?  Marland Kitchen. Ankle fracture, left   . Bimalleolar ankle fracture, left, closed, with routine healing, subsequent encounter   . Aftercare following surgery of the musculoskeletal system Yes    The patient is 6 weeks and 3 days status post open treatment internal fixation with bimalleolar fixation of a ankle fracture. Her wounds look good. Her x-ray looks good. She is placed in a Cam Walker weightbearing as tolerated. We gave her a shower chair to take a shower and we refilled her pain medication  Follow-up 6 weeks repeat x-ray  Meds ordered this encounter  Medications  . HYDROcodone-acetaminophen (NORCO) 5-325 MG per tablet    Sig: Take 1 tablet by mouth every 6 (six) hours as needed for moderate pain.    Dispense:  56 tablet    Refill:  0

## 2015-06-01 ENCOUNTER — Other Ambulatory Visit: Payer: Self-pay | Admitting: *Deleted

## 2015-06-01 MED ORDER — GABAPENTIN 100 MG PO CAPS: 100.0000 mg | ORAL_CAPSULE | Freq: Three times a day (TID) | ORAL | Status: DC

## 2015-06-02 ENCOUNTER — Other Ambulatory Visit: Payer: Self-pay | Admitting: *Deleted

## 2015-06-02 MED ORDER — GABAPENTIN 100 MG PO CAPS: 100.0000 mg | ORAL_CAPSULE | Freq: Three times a day (TID) | ORAL | Status: DC

## 2015-06-03 ENCOUNTER — Other Ambulatory Visit: Payer: Self-pay | Admitting: *Deleted

## 2015-06-03 ENCOUNTER — Telehealth: Payer: Self-pay | Admitting: Orthopedic Surgery

## 2015-06-03 MED ORDER — GABAPENTIN 100 MG PO CAPS: 100.0000 mg | ORAL_CAPSULE | Freq: Three times a day (TID) | ORAL | Status: DC

## 2015-06-03 NOTE — Telephone Encounter (Signed)
Patient called to request, per her insurance requirement,  that her prescription be refilled as "90-day supply" for medication: gabapentin (NEURONTIN) 100 MG capsule [[id number]][[id number]]  Please call her cell phone# [phone], as her land-line phone is out.

## 2015-06-03 NOTE — Telephone Encounter (Signed)
Medication sent as requested, patient aware

## 2015-07-06 ENCOUNTER — Ambulatory Visit (INDEPENDENT_AMBULATORY_CARE_PROVIDER_SITE_OTHER): Payer: Self-pay | Admitting: Orthopedic Surgery

## 2015-07-06 ENCOUNTER — Ambulatory Visit (INDEPENDENT_AMBULATORY_CARE_PROVIDER_SITE_OTHER): Payer: BLUE CROSS/BLUE SHIELD

## 2015-07-06 VITALS — BP 117/78 | Ht 64.0 in | Wt 198.0 lb

## 2015-07-06 DIAGNOSIS — S82892A Other fracture of left lower leg, initial encounter for closed fracture: Secondary | ICD-10-CM

## 2015-07-06 DIAGNOSIS — S82842D Displaced bimalleolar fracture of left lower leg, subsequent encounter for closed fracture with routine healing: Secondary | ICD-10-CM

## 2015-07-06 NOTE — Progress Notes (Signed)
Patient ID: [name], female   DOB: 13-Jul-1951, 64 y.o.   MRN: [id number]  Follow up visit  Chief Complaint  Patient presents with  . Follow-up    6 week follow up + xray left ankle fracture, DOS 04/11/15    BP 117/78 mmHg  Ht  (1.626 m)  Wt 198 lb (89.812 kg)  BMI 33.97 kg/m2  Encounter Diagnoses  Name Primary?  Marland Kitchen Ankle fracture, left   . Bimalleolar ankle fracture, left, closed, with routine healing, subsequent encounter Yes    The patient's had internal fixation of the left ankle she's doing well she been in a Cam Walker for the last few weeks. Her ankle motion is great wounds are healed her x-ray looks great she has healed her fracture regained her full range of motion and she is now weightbearing as tolerated and she has been released

## 2015-07-08 ENCOUNTER — Telehealth: Payer: Self-pay | Admitting: Orthopedic Surgery

## 2015-07-08 NOTE — Telephone Encounter (Signed)
Patient called to relay that she is having quite a bit of swelling in left ankle since out of cam walker per her office visit 07/06/15.  States is coming by the office today with a form for handicapped placard, and asked if nurse can look at ankle.  She's asking about whether she may need compression stocking?  States she will be by before noon today.

## 2015-07-08 NOTE — Telephone Encounter (Signed)
Spoke with patient, she advised she has been up on foot doing right much, advised some swelling is to be expected, to elevate at the end of the day and as long as swelling goes down over night should be fine, advised if continues to bother her to call back and not to over do it

## 2015-09-03 ENCOUNTER — Encounter (INDEPENDENT_AMBULATORY_CARE_PROVIDER_SITE_OTHER): Payer: Self-pay | Admitting: *Deleted

## 2015-09-28 ENCOUNTER — Other Ambulatory Visit: Payer: Self-pay | Admitting: Orthopedic Surgery

## 2015-10-01 ENCOUNTER — Other Ambulatory Visit: Payer: Self-pay | Admitting: *Deleted

## 2015-10-01 MED ORDER — GABAPENTIN 100 MG PO CAPS: 100.0000 mg | ORAL_CAPSULE | Freq: Three times a day (TID) | ORAL | Status: DC

## 2016-01-05 ENCOUNTER — Other Ambulatory Visit: Payer: Self-pay | Admitting: *Deleted

## 2016-01-05 ENCOUNTER — Telehealth: Payer: Self-pay | Admitting: Orthopedic Surgery

## 2016-01-05 MED ORDER — CEPHALEXIN 500 MG PO CAPS: ORAL_CAPSULE | ORAL | Status: DC

## 2016-01-05 NOTE — Telephone Encounter (Signed)
Patient called and said that she was having a dental procedure tomorrow and that her dentist wanted her to get a prescription from Dr. Romeo Apple.  She said it was to keep her from getting an infection.

## 2016-01-05 NOTE — Telephone Encounter (Signed)
MEDICATION SENT TO PHARMACY  CALLED PATIENT, NO ANSWER, LEFT MESSAGE

## 2016-01-05 NOTE — Telephone Encounter (Signed)
Tabitha from Dr Pernell Dupre (dentist) office in [place] called back and had patient speak with our office to follow up on the pre-medication request.  States having Xrays only at dentist appointment today, 01/05/16, and will re-schedule cleaning. Her pharmacy is [facility], [place].  Patient ph# is [phone]

## 2017-05-20 IMAGING — CR DG CHEST 1V
1 series · 1 of 1 positions shown · non-contrast
Comparison: None.

CLINICAL DATA: Preop cardiovascular exam

EXAM:
CHEST  1 VIEW

[ap portable]
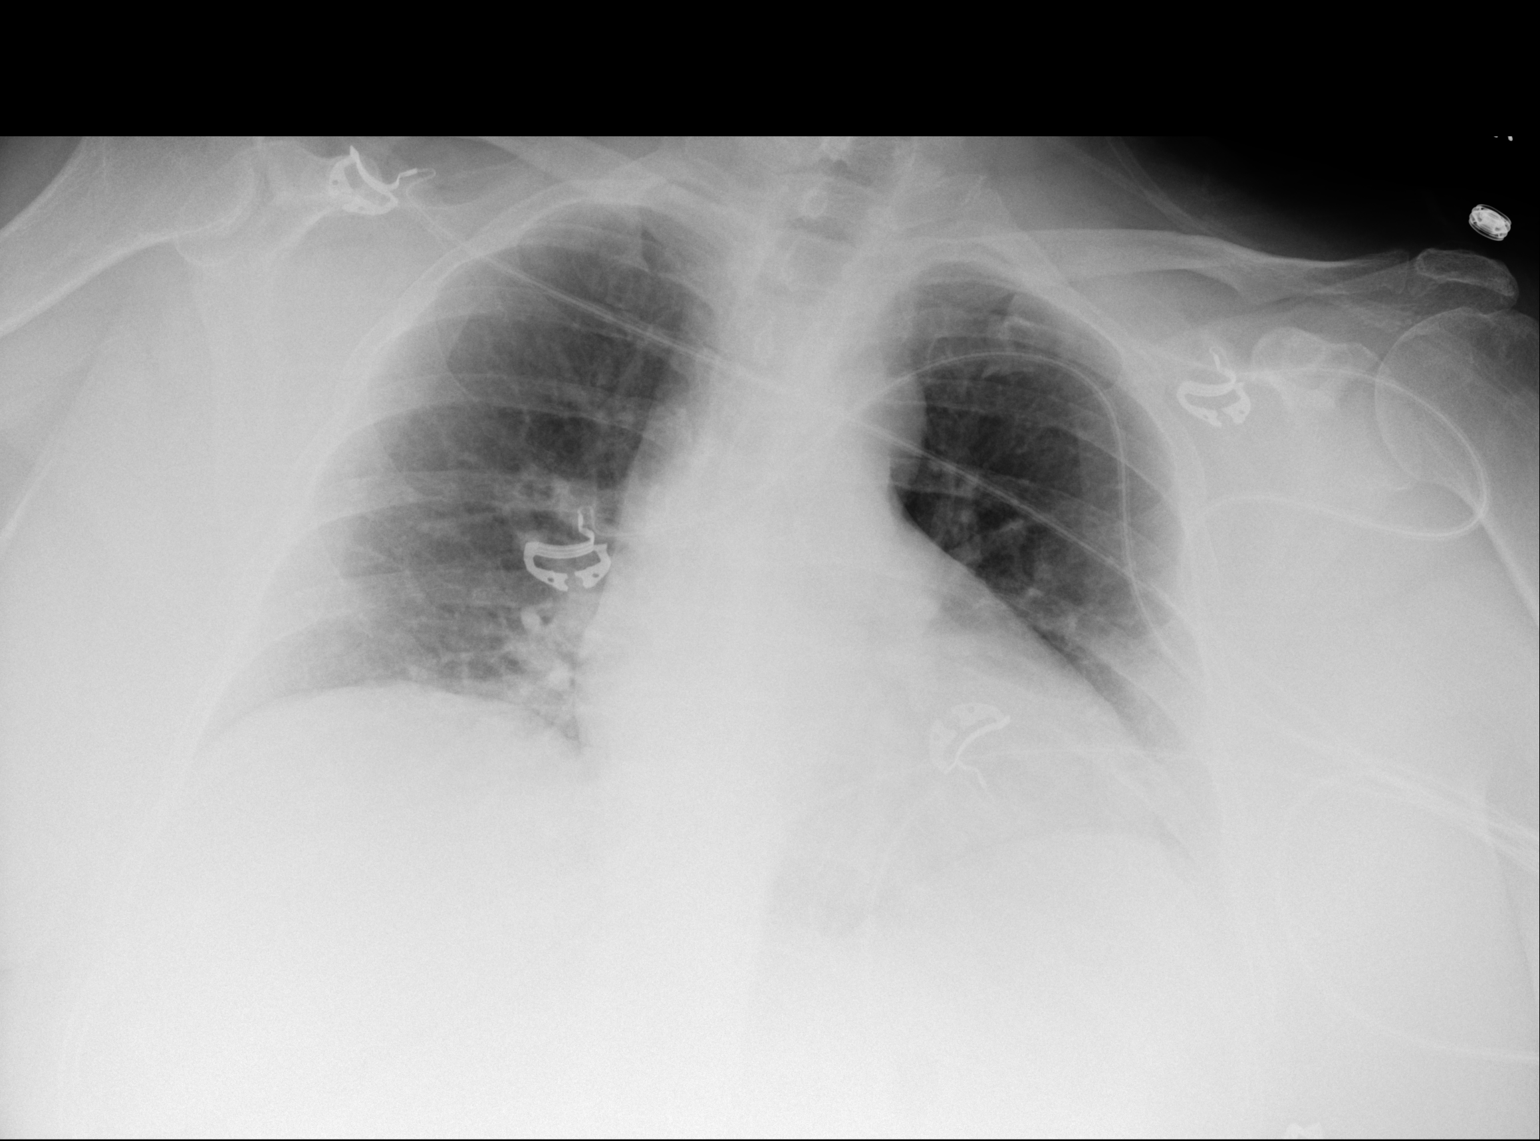

[1 of 1 positions shown; findings below may reference images not displayed]

FINDINGS: Borderline cardiomegaly, with heart size accentuated by low volumes.
Negative aortic and hilar contours. There is no edema,
consolidation, effusion, or pneumothorax.
IMPRESSION: 1. Hypoventilation and borderline cardiomegaly.
2. No acute findings.

## 2017-05-20 IMAGING — DX DG FOOT COMPLETE 3+V*L*
3 series · 3 of 3 positions shown · non-contrast
Comparison: None.

CLINICAL DATA: Tripped over dog and fell. Deformity and pain at the
left ankle. Initial encounter.

EXAM:
LEFT FOOT - COMPLETE 3+ VIEW

[foot ap]
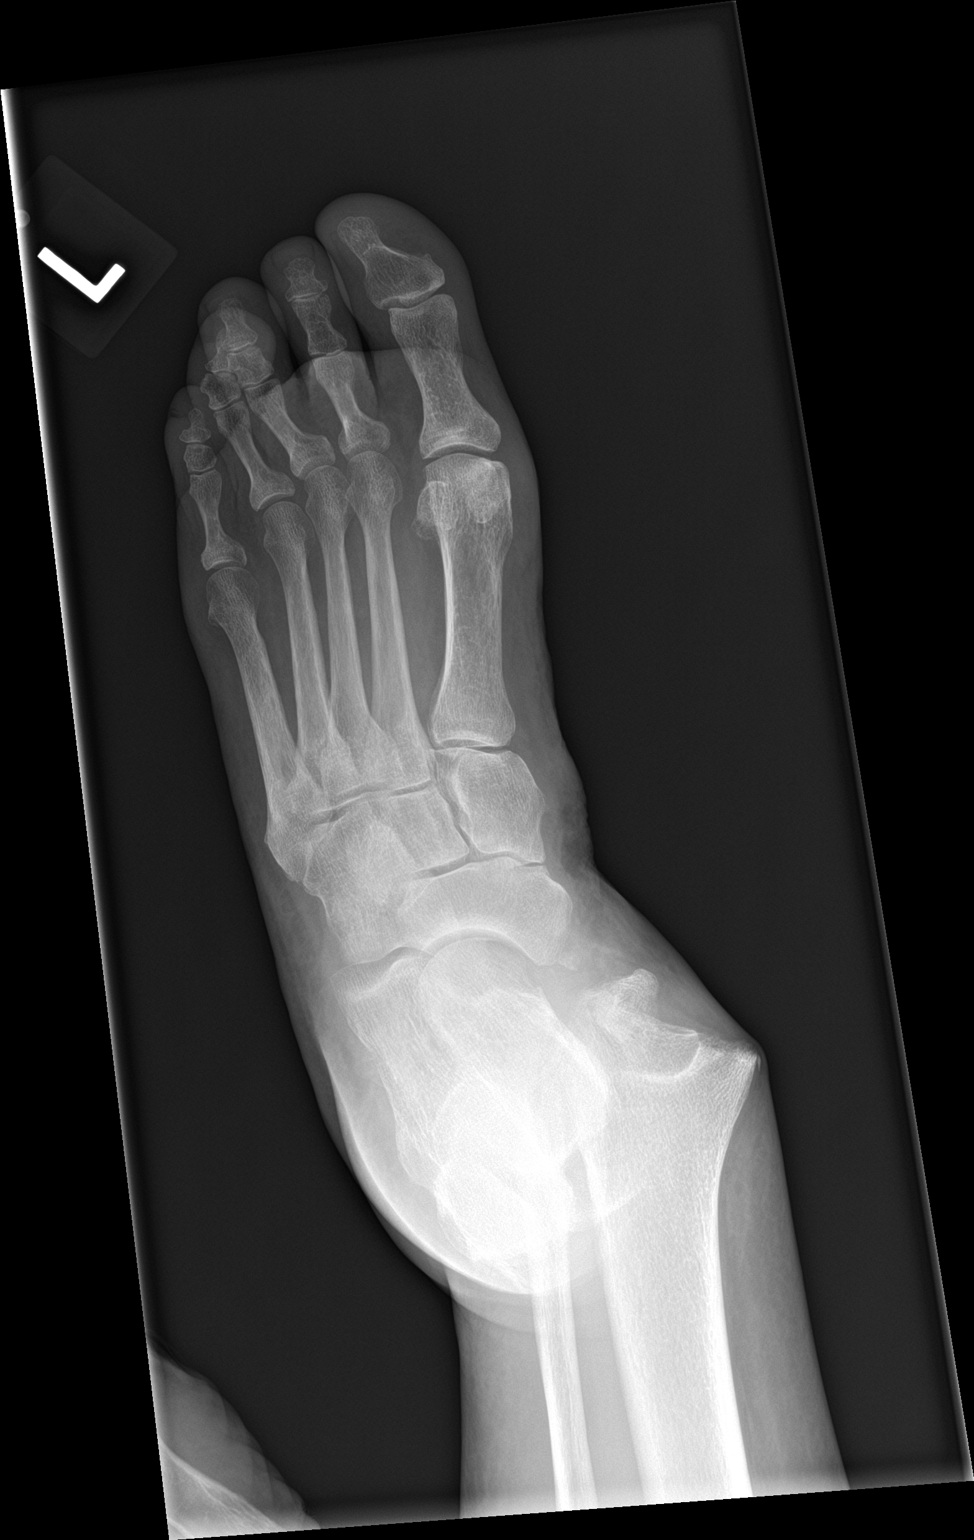

[foot obl]
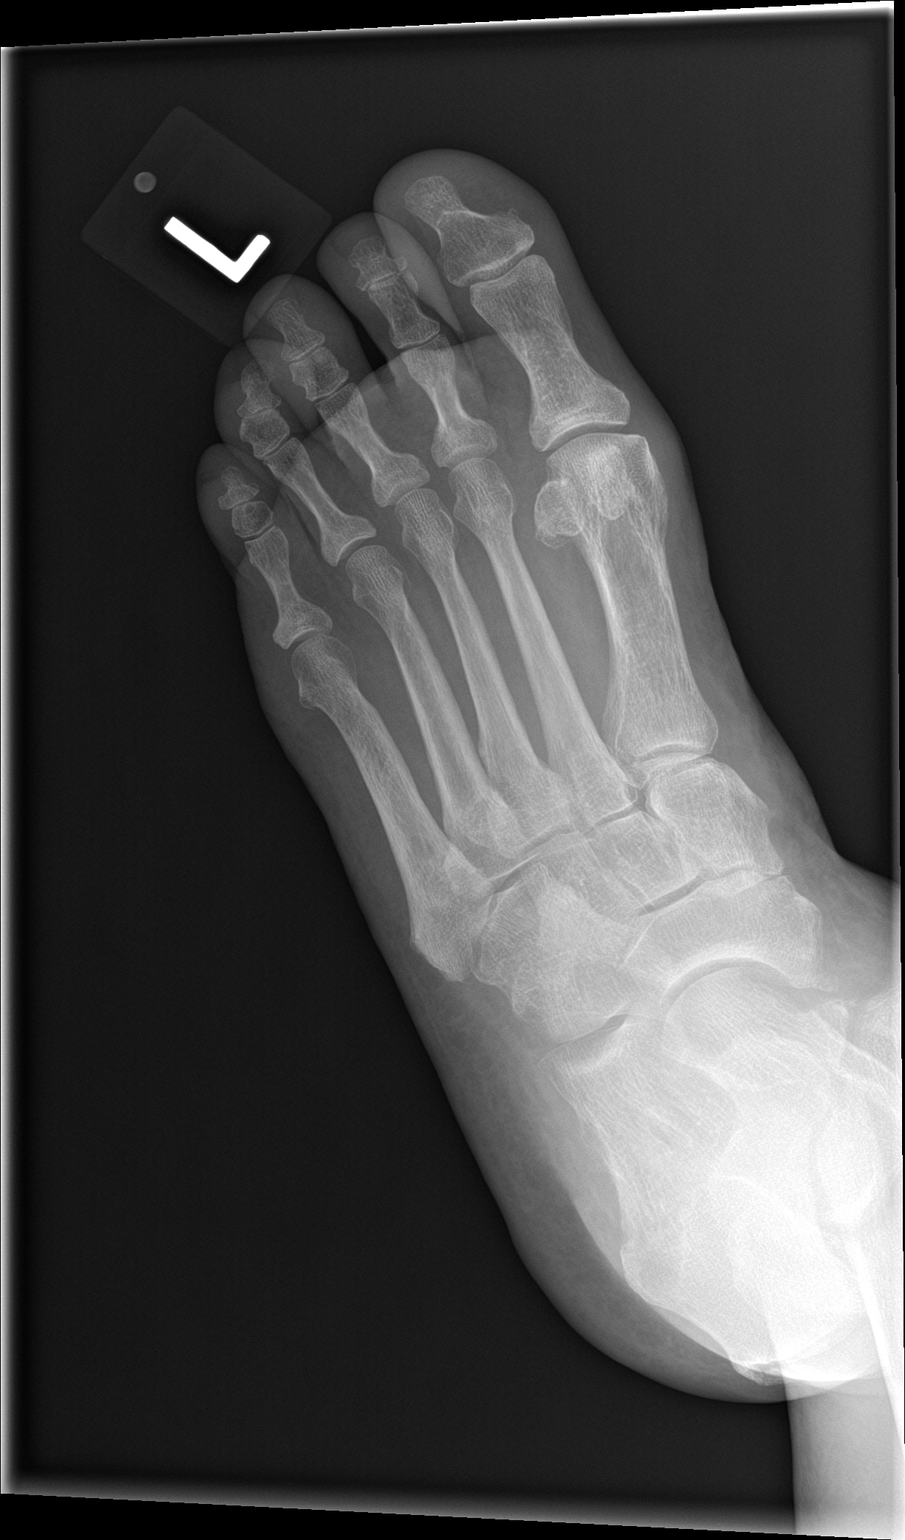

[foot lat]
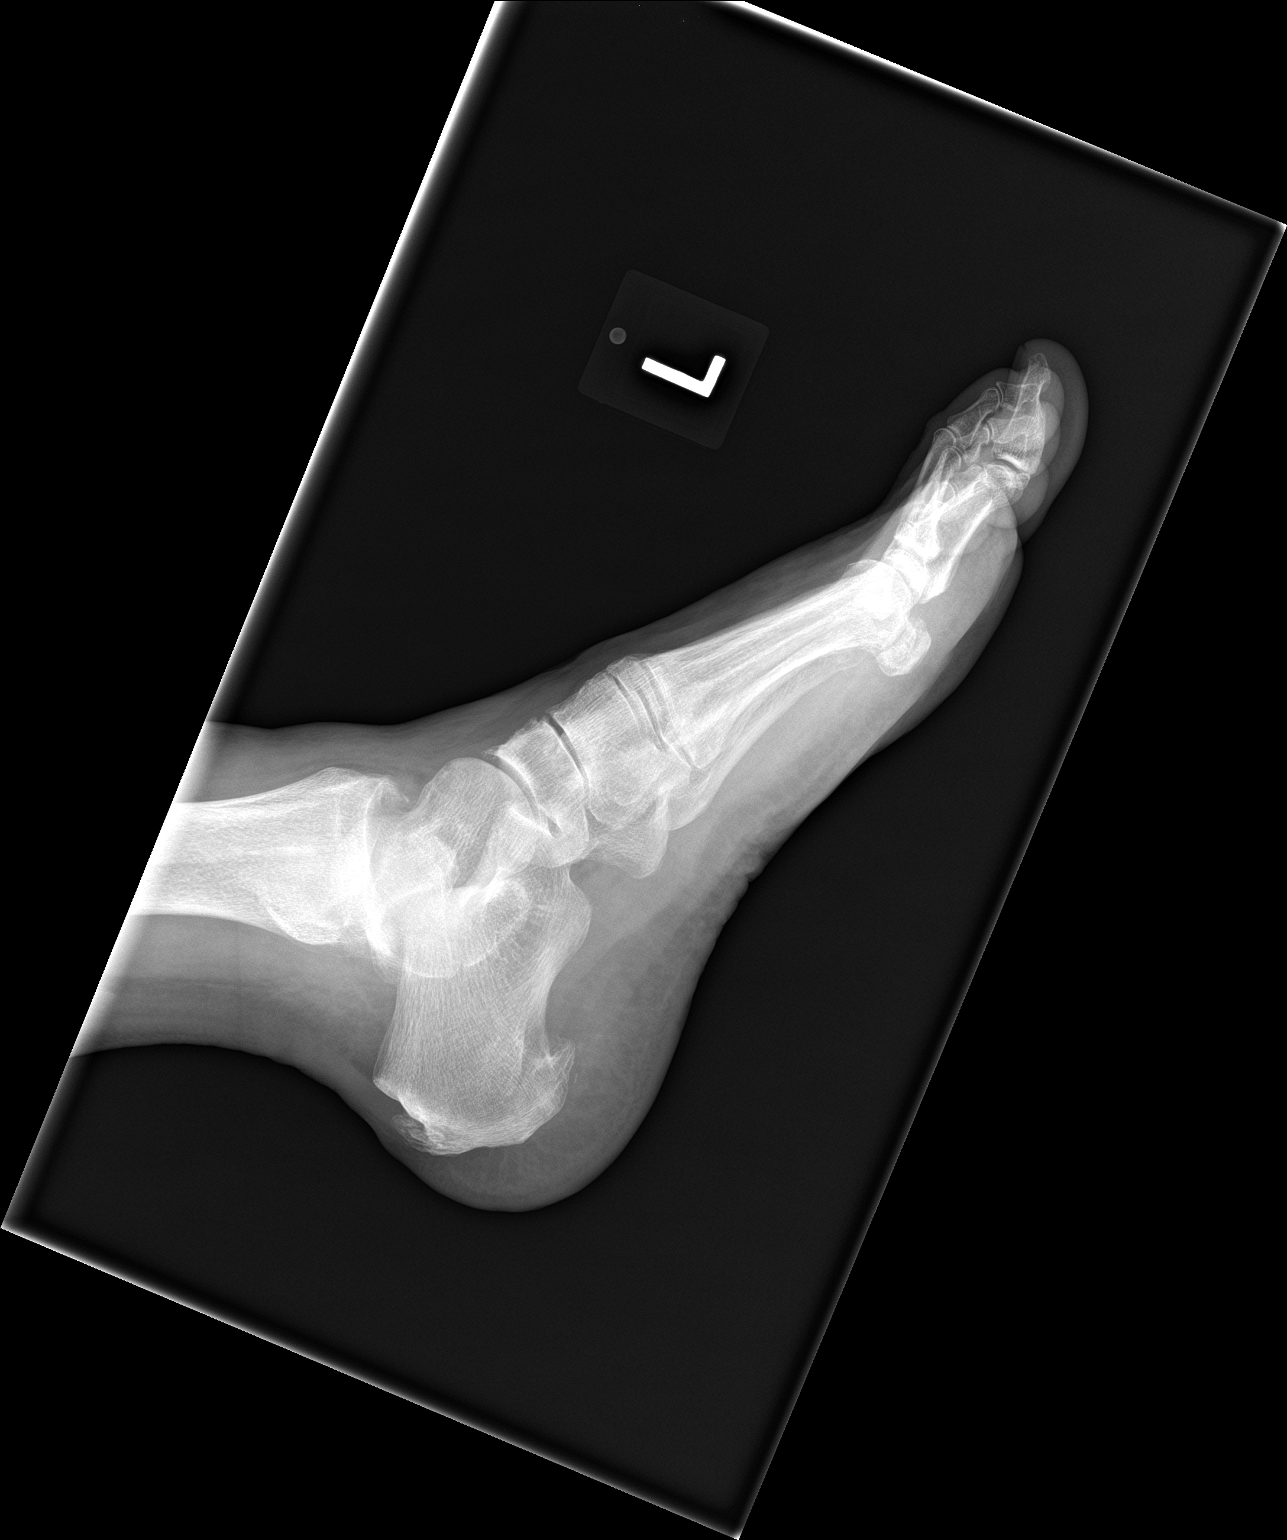

[3 of 3 positions shown; findings below may reference images not displayed]

FINDINGS: There is a trimalleolar fracture/dislocation involving the left
ankle, with lateral and posterior dislocation of the talus, and
lateral displacement of the medial and lateral malleoli. There is
also shortening at the distal fibular fracture. The posterior
malleolar fracture is somewhat rotated and mildly superiorly
displaced.

No additional fractures are seen. Visualized joint spaces are
otherwise grossly unremarkable. Plantar and posterior calcaneal
spurs are seen. Soft tissue swelling is noted about the fracture
sites.
IMPRESSION: Trimalleolar fracture/dislocation involving the left ankle, with
lateral and posterior dislocation of the talus, and lateral
displacement of the medial and lateral malleoli. Shortening also
noted at the distal fibular fracture. Posterior malleolar fracture
is somewhat rotated and mildly superiorly displaced.

## 2017-05-21 IMAGING — RF DG C-ARM 1-60 MIN-NO REPORT
1 series · 8 of 8 positions shown · non-contrast
Comparison: 04/10/2015

CLINICAL DATA: Open reduction internal fixation left ankle fracture

EXAM:
DG C-ARM 1-60 MIN - [facility]; LEFT ANKLE - 2 VIEW

[Series 1: run · 8 of 8 slices shown]
[im 1/8]
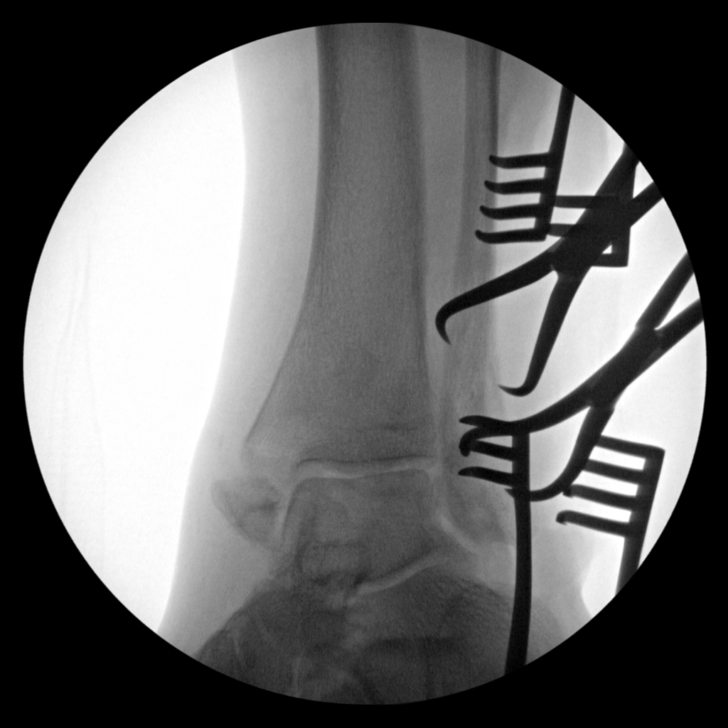
[im 2/8]
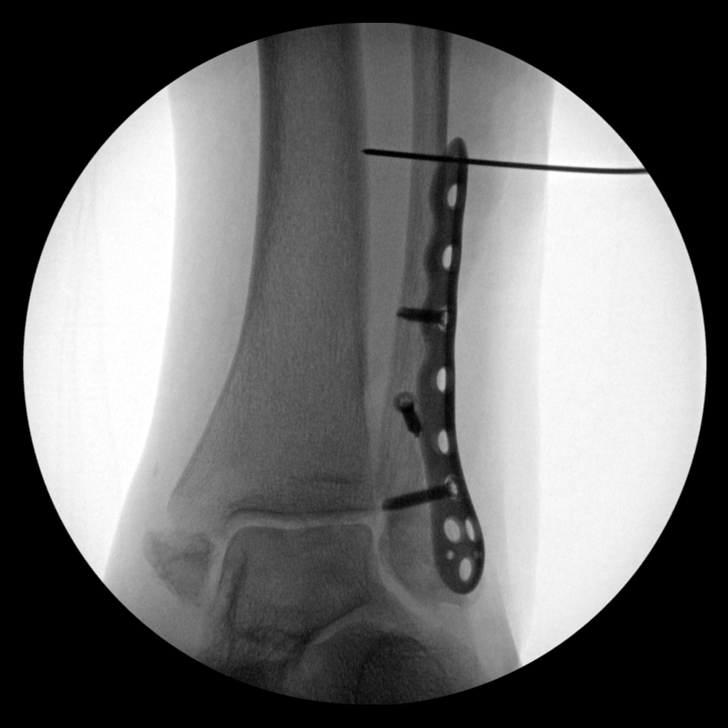
[im 3/8]
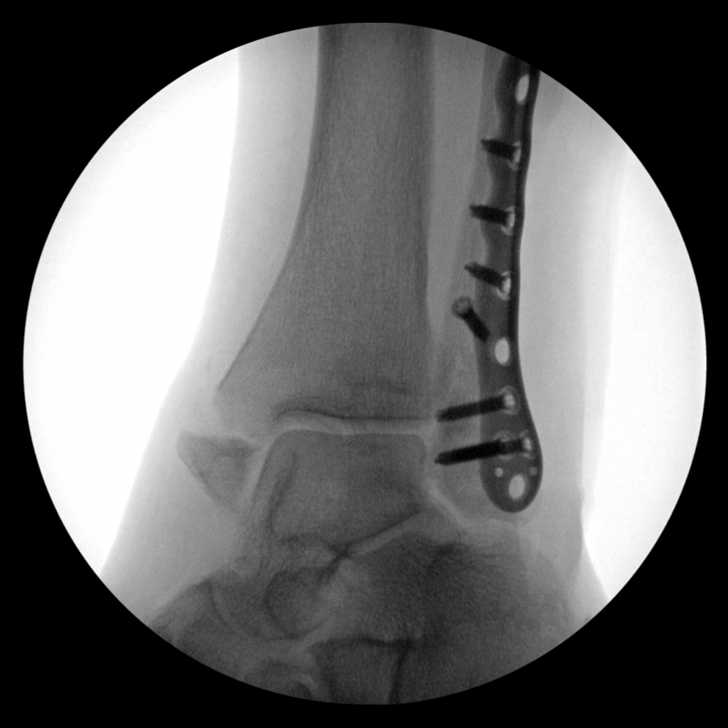
[im 4/8]
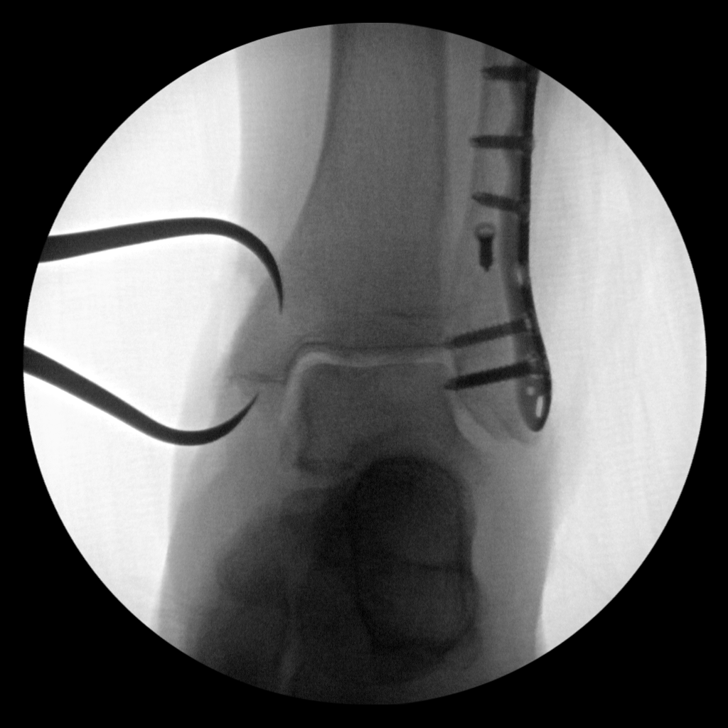
[im 5/8]
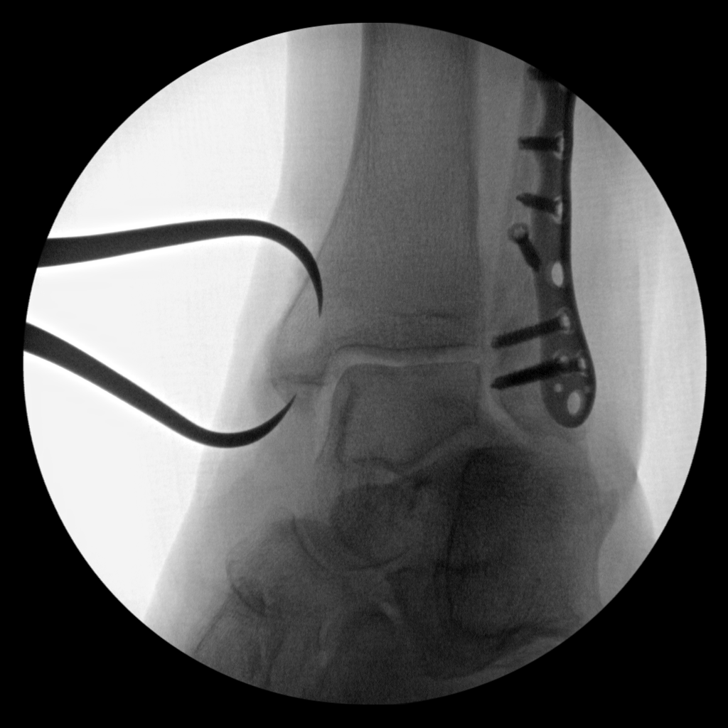
[im 6/8]
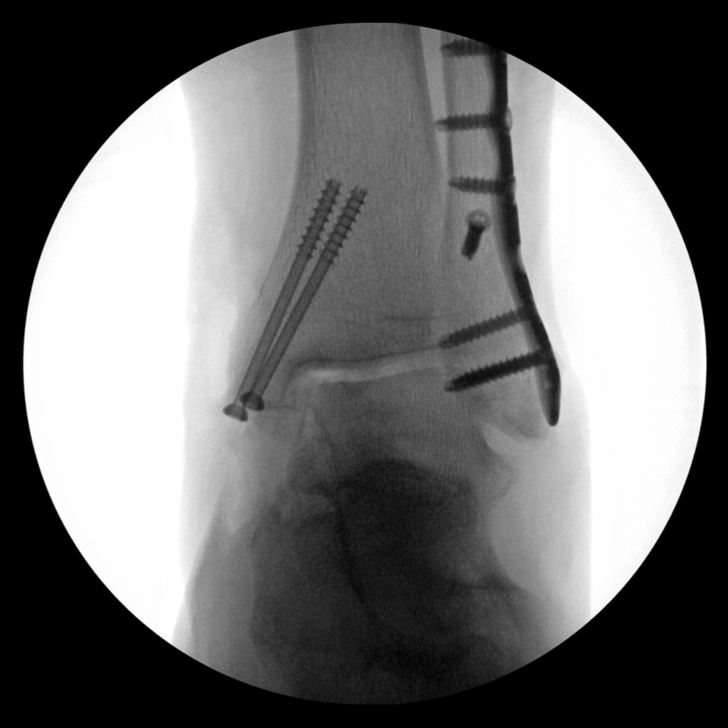
[im 7/8]
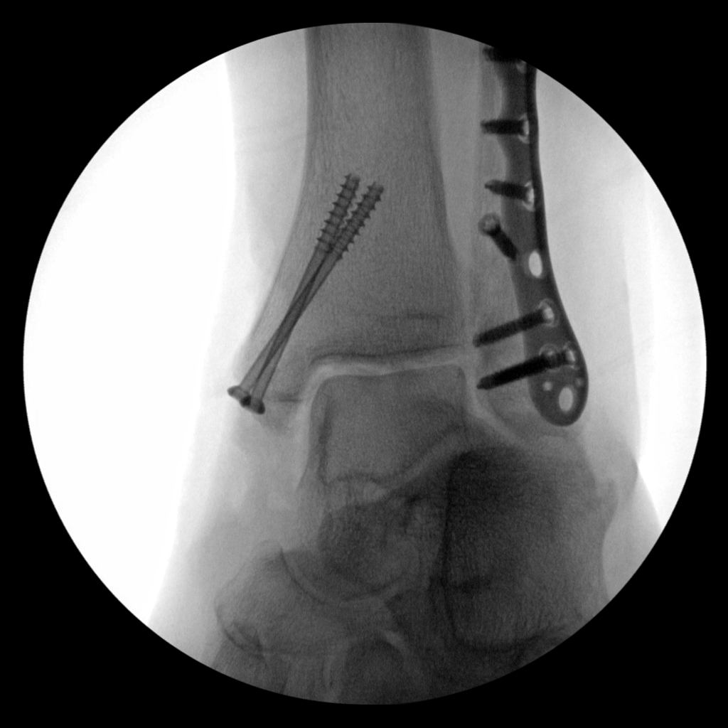
[im 8/8]
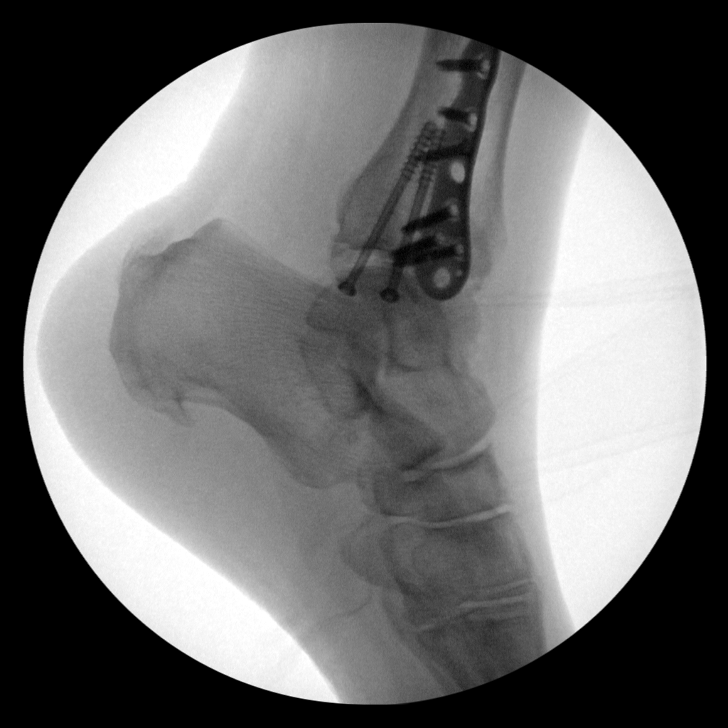

[8 of 8 positions shown; findings below may reference images not displayed]

FLUOROSCOPY TIME:  Radiation Exposure Index (as provided by the
fluoroscopic device): 2.3 mGy

If the device does not provide the exposure index:

Fluoroscopy Time:  dictate in minutes and seconds

Number of Acquired Images:  8
FINDINGS: The patient is status post intraoperative repair of bimalleolar left
ankle fracture. Two metallic fixation screws are noted in distal
tibia medial malleolus. Metallic fixation plate and screws are noted
in distal fibula. There is anatomic alignment. Plantar spur of
calcaneus is noted.
IMPRESSION: Status post intraoperative repair of left ankle bimalleolar
fracture. 2 oblique metallic fixation screws are noted in distal
tibia medial malleolus. Metallic fixation plate and screws are noted
in distal fibula. There is anatomic alignment.

## 2017-10-10 ENCOUNTER — Emergency Department (HOSPITAL_COMMUNITY): Payer: Medicare Other

## 2017-10-10 ENCOUNTER — Encounter (HOSPITAL_COMMUNITY): Payer: Self-pay

## 2017-10-10 ENCOUNTER — Emergency Department (HOSPITAL_COMMUNITY)
Admission: EM | Admit: 2017-10-10 | Discharge: 2017-10-10 | Disposition: A | Discharge status: Home / Self Care | Payer: Medicare Other | Attending: Emergency Medicine | Admitting: Emergency Medicine

## 2017-10-10 DIAGNOSIS — R300 Dysuria: Secondary | ICD-10-CM | POA: Diagnosis not present

## 2017-10-10 DIAGNOSIS — Z79899 Other long term (current) drug therapy: Secondary | ICD-10-CM | POA: Insufficient documentation

## 2017-10-10 DIAGNOSIS — R1012 Left upper quadrant pain: Secondary | ICD-10-CM | POA: Diagnosis not present

## 2017-10-10 DIAGNOSIS — R109 Unspecified abdominal pain: Secondary | ICD-10-CM

## 2017-10-10 DIAGNOSIS — I10 Essential (primary) hypertension: Secondary | ICD-10-CM | POA: Diagnosis not present

## 2017-10-10 HISTORY — DX: Essential (primary) hypertension: I10

## 2017-10-10 HISTORY — DX: Migraine, unspecified, not intractable, without status migrainosus: G43.909

## 2017-10-10 LAB — URINALYSIS, ROUTINE W REFLEX MICROSCOPIC
BILIRUBIN URINE: NEGATIVE
Glucose, UA: NEGATIVE mg/dL
KETONES UR: NEGATIVE mg/dL
Leukocytes, UA: NEGATIVE
NITRITE: NEGATIVE
PH: 6 (ref 5.0–8.0)
Protein, ur: NEGATIVE mg/dL
Specific Gravity, Urine: 1.01 (ref 1.005–1.030)

## 2017-10-10 LAB — BASIC METABOLIC PANEL
ANION GAP: 11 (ref 5–15)
BUN: 12 mg/dL (ref 6–20)
CALCIUM: 9.9 mg/dL (ref 8.9–10.3)
CO2: 25 mmol/L (ref 22–32)
Chloride: 100 mmol/L — ABNORMAL LOW (ref 101–111)
Creatinine, Ser: 0.76 mg/dL (ref 0.44–1.00)
GFR calc Af Amer: >60 mL/min (ref >60)
GFR calc non Af Amer: >60 mL/min (ref >60)
GLUCOSE: 95 mg/dL (ref 65–99)
Potassium: 3.9 mmol/L (ref 3.5–5.1)
SODIUM: 136 mmol/L (ref 135–145)

## 2017-10-10 LAB — CBC WITH DIFFERENTIAL/PLATELET
BASOS ABS: 0 10*3/uL (ref 0.0–0.1)
BASOS PCT: 0 %
EOS PCT: 1 %
Eosinophils Absolute: 0.1 10*3/uL (ref 0.0–0.7)
HCT: 44.5 % (ref 36.0–46.0)
Hemoglobin: 14.7 g/dL (ref 12.0–15.0)
Lymphocytes Relative: 21 %
Lymphs Abs: 2.1 10*3/uL (ref 0.7–4.0)
MCH: 29.8 pg (ref 26.0–34.0)
MCHC: 33 g/dL (ref 30.0–36.0)
MCV: 90.3 fL (ref 78.0–100.0)
Monocytes Absolute: 0.5 10*3/uL (ref 0.1–1.0)
Monocytes Relative: 5 %
Neutro Abs: 7.3 10*3/uL (ref 1.7–7.7)
Neutrophils Relative %: 73 %
Platelets: 235 10*3/uL (ref 150–400)
RBC: 4.93 MIL/uL (ref 3.87–5.11)
RDW: 12.5 % (ref 11.5–15.5)
WBC: 10 10*3/uL (ref 4.0–10.5)

## 2017-10-10 MED ORDER — CEPHALEXIN 500 MG PO CAPS: 500.0000 mg | ORAL_CAPSULE | Freq: Four times a day (QID) | ORAL | 0 refills | Status: DC

## 2017-10-10 MED ORDER — ONDANSETRON 4 MG PO TBDP: ORAL_TABLET | ORAL | 0 refills | Status: AC

## 2017-10-10 MED ORDER — TRAMADOL HCL 50 MG PO TABS: 50.0000 mg | ORAL_TABLET | Freq: Four times a day (QID) | ORAL | 0 refills | Status: AC | PRN

## 2017-10-10 NOTE — ED Triage Notes (Signed)
Pt reports left flank pain x 1 week and noticed burning with urination since yesterday.

## 2017-10-10 NOTE — ED Provider Notes (Signed)
[facility] EMERGENCY DEPARTMENT Provider Note   CSN: [id number] Arrival date & time: 10/10/17  1013     History   Chief Complaint Chief Complaint  Patient presents with  . Flank Pain    HPI [name] is a 66 y.o. female.  Patient complains of dysuria and left flank pain for a couple days   The history is provided by the patient.  Flank Pain  This is a new problem. The current episode started 2 days ago. The problem occurs constantly. The problem has not changed since onset.Associated symptoms include chest pain. Pertinent negatives include no abdominal pain and no headaches. Nothing aggravates the symptoms. Nothing relieves the symptoms. She has tried nothing for the symptoms. The treatment provided no relief.    Past Medical History:  Diagnosis Date  . Hypertension   . Migraine     Patient Active Problem List   Diagnosis Date Noted  . Bimalleolar ankle fracture 04/11/2015  . Ankle fracture, bimalleolar, closed 04/10/2015  . OA (osteoarthritis) of knee 04/10/2014  . Status post total knee replacement 03/13/2012  . S/P total knee replacement 06/01/2011  . TOTAL KNEE FOLLOW-UP 02/14/2008  . Osteoarthrosis, unspecified whether generalized or localized, lower leg 10/04/2007  . KNEE PAIN 10/04/2007    Past Surgical History:  Procedure Laterality Date  . HERNIA REPAIR    . knee athroscopy both knees    . ORIF ANKLE FRACTURE Left 04/11/2015   Procedure: OPEN REDUCTION INTERNAL FIXATION LEFT ANKLE;  Surgeon: Vickki HearingStanley E Harrison, MD;  Location: [facility];  Service: Orthopedics;  Laterality: Left;  . TOTAL ABDOMINAL HYSTERECTOMY    . total athroplasty rt knee  2009    OB History    No data available       Home Medications    Prior to Admission medications   Medication Sig Start Date End Date Taking? Authorizing Provider  butalbital-acetaminophen-caffeine (FIORICET, ESGIC) 50-325-40 MG per tablet Take 1-2 tablets by mouth every 8 (eight) hours as needed. For  migraine pain relief 03/17/15  Yes [provider]  citalopram (CELEXA) 20 MG tablet Take 20 mg by mouth daily.    Yes [provider]  potassium chloride (MICRO-K) 10 MEQ CR capsule Take 2 capsules by mouth 2 (two) times daily. 02/16/15  Yes [provider]  simvastatin (ZOCOR) 40 MG tablet Take 40 mg by mouth at bedtime.  02/24/11  Yes [provider]  triamterene-hydrochlorothiazide (DYAZIDE) 37.5-25 MG per capsule Take 2 capsules by mouth daily. 02/16/15  Yes [provider]  zolpidem (AMBIEN) 10 MG tablet Take 10 mg by mouth at bedtime as needed for sleep.  05/14/11  Yes [provider]  cephALEXin (KEFLEX) 500 MG capsule Take 1 capsule (500 mg total) by mouth 4 (four) times daily. 10/10/17   Bethann BerkshireZammit, Elenie Coven, MD  ondansetron (ZOFRAN ODT) 4 MG disintegrating tablet 4mg  ODT q4 hours prn nausea/vomit 10/10/17   Bethann BerkshireZammit, Brendan Gadson, MD  traMADol (ULTRAM) 50 MG tablet Take 1 tablet (50 mg total) by mouth every 6 (six) hours as needed. 10/10/17   Bethann BerkshireZammit, Augusto Deckman, MD    Family History No family history on file.  Social History Social History   Tobacco Use  . Smoking status: Never Smoker  . Smokeless tobacco: Never Used  Substance Use Topics  . Alcohol use: No  . Drug use: No     Allergies   Erythromycin   Review of Systems Review of Systems  Constitutional: Negative for appetite change and fatigue.  HENT:  Negative for congestion, ear discharge and sinus pressure.   Eyes: Negative for discharge.  Respiratory: Negative for cough.   Cardiovascular: Positive for chest pain.  Gastrointestinal: Negative for abdominal pain and diarrhea.  Genitourinary: Positive for dysuria and flank pain. Negative for frequency and hematuria.  Musculoskeletal: Negative for back pain.  Skin: Negative for rash.  Neurological: Negative for seizures and headaches.  Psychiatric/Behavioral: Negative for hallucinations.     Physical Exam Updated Vital Signs BP  119/72   Pulse 87   Temp 98.6 F (37 C) (Oral)   Resp 18   Ht 5\' 3"  (1.6 m)   Wt 87.1 kg (192 lb)   SpO2 98%   BMI 34.01 kg/m   Physical Exam  Constitutional: She is oriented to person, place, and time. She appears well-developed.  HENT:  Head: Normocephalic.  Eyes: Conjunctivae and EOM are normal. No scleral icterus.  Neck: Neck supple. No thyromegaly present.  Cardiovascular: Normal rate and regular rhythm. Exam reveals no gallop and no friction rub.  No murmur heard. Pulmonary/Chest: No stridor. She has no wheezes. She has no rales. She exhibits no tenderness.  Abdominal: She exhibits no distension. There is no tenderness. There is no rebound.  Musculoskeletal: Normal range of motion. She exhibits no edema.  Tender left flank  Lymphadenopathy:    She has no cervical adenopathy.  Neurological: She is oriented to person, place, and time. She exhibits normal muscle tone. Coordination normal.  Skin: No rash noted. No erythema.  Psychiatric: She has a normal mood and affect. Her behavior is normal.     ED Treatments / Results  Labs (all labs ordered are listed, but only abnormal results are displayed) Labs Reviewed  BASIC METABOLIC PANEL - Abnormal; Notable for the following components:      Result Value   Chloride 100 (*)    All other components within normal limits  URINALYSIS, ROUTINE W REFLEX MICROSCOPIC - Abnormal; Notable for the following components:   Color, Urine STRAW (*)    Hgb urine dipstick SMALL (*)    Bacteria, UA RARE (*)    Squamous Epithelial / LPF 0-5 (*)    All other components within normal limits  URINE CULTURE  CBC WITH DIFFERENTIAL/PLATELET    EKG  EKG Interpretation None       Radiology Ct Renal Stone Study  Result Date: 10/10/2017 CLINICAL DATA:  66 year old female with left flank pain for the past week. Burning with urination starting 10/09/2017. microhematuria. Post total abdominal hysterectomy. Prior hernia repair. Initial  encounter. EXAM: CT ABDOMEN AND PELVIS WITHOUT CONTRAST TECHNIQUE: Multidetector CT imaging of the abdomen and pelvis was performed following the standard protocol without IV contrast. COMPARISON:  None. FINDINGS: Lower chest: Basilar subsegmental atelectasis/ scarring. No worrisome lung base abnormality. Heart size within normal limits. Hepatobiliary: Dome of right lobe liver with 2 tiny low-density structures too small to characterize possibly cysts. No calcified gallstone. Gallbladder sludge may be present. Pancreas: Taking into account limitation by non contrast imaging, no pancreatic mass or inflammation. Spleen: Taking into account limitation by non contrast imaging, no splenic mass or enlargement. Adrenals/Urinary Tract: No adrenal mass. No renal or ureteral obstructing stone or hydronephrosis. Taking into account limitation by non contrast imaging, no worrisome renal lesion. Urinary bladder underdistended. Along the anterior dome, 5 mm rounded lucency possibly a small diverticulum although evaluation of bladder mucosa limited without contrast filled images. Stomach/Bowel: Under distended stomach and colon. No extraluminal bowel inflammatory process. Appendix not visualized and may been  removed at time of hysterectomy. Vascular/Lymphatic: Mild aortic and iliac artery calcifications. No abdominal aortic aneurysm. No adenopathy. Reproductive: Post hysterectomy there no worrisome adnexal mass. Other: Post anterior abdominal wall hernia repair. Bowel extends along the posterior aspect of the mass. No bowel containing hernia. Surgical clips left femoral artery region. Musculoskeletal: Degenerative changes lower lumbar spine with grade 1 anterior slip L4. Prominent disc space narrowing L2-3 through L5-S1. IMPRESSION: No renal or ureteral obstructing stone or hydronephrosis. Bladder under distended. Along the anterior dome, 5 mm rounded lucency possibly a small diverticulum although evaluation of bladder mucosa  limited without contrast filled images. If there is persistent microhematuria, further evaluation may be indicated. No bowel inflammatory process. Post anterior abdominal wall hernia repair. Post hysterectomy. Degenerative changes lumbar spine Aortic Atherosclerosis (ICD10-I70.0). Electronically Signed   By: Lacy DuverneySteven  Olson M.D.   On: 10/10/2017 15:36    Procedures Procedures (including critical care time)  Medications Ordered in ED Medications - No data to display   Initial Impression / Assessment and Plan / ED Course  I have reviewed the triage vital signs and the nursing notes.  Pertinent labs & imaging results that were available during my care of the patient were reviewed by me and considered in my medical decision making (see chart for details).     Patient with left flank pain and dysuria.  Labs unremarkable.  CT scan did not show any kidney stones.  Patient will have a urine culture done and will be treated empirically with antibiotics and given pain medicine and nausea medicine and will follow up with either family doctor or urology in a week  Final Clinical Impressions(s) / ED Diagnoses   Final diagnoses:  Flank pain    ED Discharge Orders        Ordered    cephALEXin (KEFLEX) 500 MG capsule  4 times daily     10/10/17 1545    traMADol (ULTRAM) 50 MG tablet  Every 6 hours PRN     10/10/17 1545    ondansetron (ZOFRAN ODT) 4 MG disintegrating tablet     10/10/17 1545       Bethann BerkshireZammit, Herbie Lehrmann, MD 10/10/17 1547

## 2017-10-10 NOTE — Discharge Instructions (Signed)
Follow-up with your family doctor for [facility] in 1 week

## 2017-10-11 ENCOUNTER — Ambulatory Visit (INDEPENDENT_AMBULATORY_CARE_PROVIDER_SITE_OTHER): Payer: Medicare Other | Admitting: Urology

## 2017-10-11 DIAGNOSIS — R1084 Generalized abdominal pain: Secondary | ICD-10-CM | POA: Diagnosis not present

## 2017-10-12 LAB — URINE CULTURE

## 2017-10-20 DIAGNOSIS — R109 Unspecified abdominal pain: Secondary | ICD-10-CM | POA: Insufficient documentation

## 2018-02-09 ENCOUNTER — Telehealth: Payer: Self-pay | Admitting: Orthopedic Surgery

## 2018-02-09 MED ORDER — CEPHALEXIN 500 MG PO CAPS: 2000.0000 mg | ORAL_CAPSULE | Freq: Once | ORAL | 2 refills | Status: AC

## 2018-02-09 NOTE — Telephone Encounter (Signed)
[facility] to [facility] per [facility] / called patient to advise.

## 2018-02-09 NOTE — Telephone Encounter (Signed)
Patient called and stated she is having dental work done on Monday and she forgot to call and ask for antibiotic.  [facility] [place].

## 2018-08-15 ENCOUNTER — Other Ambulatory Visit: Payer: Self-pay | Admitting: Radiology

## 2018-08-15 ENCOUNTER — Telehealth: Payer: Self-pay | Admitting: Orthopedic Surgery

## 2018-08-15 MED ORDER — CEPHALEXIN 500 MG PO CAPS: 2000.0000 mg | ORAL_CAPSULE | Freq: Once | ORAL | 2 refills | Status: AC

## 2018-08-15 NOTE — Telephone Encounter (Signed)
Sent in per protocol called patient to advise.

## 2018-08-15 NOTE — Telephone Encounter (Signed)
Sent in per protocol and advised patient.

## 2018-08-15 NOTE — Telephone Encounter (Signed)
Patient called asking for the antibiotic she needs to take before going to her dentist on Monday.   PATIENT USES [facility]

## 2021-03-16 ENCOUNTER — Telehealth: Payer: Self-pay | Admitting: Orthopedic Surgery

## 2021-03-16 MED ORDER — CEPHALEXIN 500 MG PO CAPS: 2000.0000 mg | ORAL_CAPSULE | Freq: Once | ORAL | 2 refills | Status: AC

## 2021-03-16 NOTE — Telephone Encounter (Signed)
Patient is going to the dentist and needs medicine called [facility]  [facility]

## 2021-09-22 ENCOUNTER — Telehealth: Payer: Self-pay | Admitting: Orthopedic Surgery

## 2021-09-22 MED ORDER — CEPHALEXIN 500 MG PO CAPS: 2000.0000 mg | ORAL_CAPSULE | Freq: Once | ORAL | 2 refills | Status: AC

## 2021-09-22 NOTE — Telephone Encounter (Signed)
Sent in per protocol patient has knee replacement

## 2021-09-22 NOTE — Telephone Encounter (Signed)
Patient called for pre-med antibiotics for dental appointment scheduled 09/27/21; states uses [facility], [place].

## 2022-02-09 ENCOUNTER — Ambulatory Visit (INDEPENDENT_AMBULATORY_CARE_PROVIDER_SITE_OTHER): Payer: Medicare Other | Admitting: Orthopedic Surgery

## 2022-02-09 ENCOUNTER — Other Ambulatory Visit: Payer: Self-pay

## 2022-02-09 ENCOUNTER — Ambulatory Visit: Payer: Medicare Other

## 2022-02-09 VITALS — BP 114/68 | HR 70 | Ht 63.0 in | Wt 180.0 lb

## 2022-02-09 DIAGNOSIS — M19011 Primary osteoarthritis, right shoulder: Secondary | ICD-10-CM | POA: Diagnosis not present

## 2022-02-09 DIAGNOSIS — M75101 Unspecified rotator cuff tear or rupture of right shoulder, not specified as traumatic: Secondary | ICD-10-CM | POA: Diagnosis not present

## 2022-02-09 DIAGNOSIS — M25511 Pain in right shoulder: Secondary | ICD-10-CM | POA: Diagnosis not present

## 2022-02-09 DIAGNOSIS — M19019 Primary osteoarthritis, unspecified shoulder: Secondary | ICD-10-CM

## 2022-02-09 NOTE — Progress Notes (Signed)
? ?Patient ID: [name], female   DOB: 12-Mar-1951, 71 y.o.   MRN: [id number] ? ?ASSESSMENT AND PLAN: ? ?[name] is 71 years old she has atraumatic onset of pain in her right shoulder but has significant weakness and her x-ray shows arthritis in acromiohumeral distance decrease indicating rotator cuff disease ? ?Recommend injection and MRI to assess the cuff for tear ? ? ?Procedure note the subacromial injection shoulder RIGHT  ? ? ?Verbal consent was obtained to inject the  RIGHT   Shoulder ? ?Timeout was completed to confirm the injection site is a subacromial space of the  RIGHT  shoulder ? ? ?Medication used Depo-Medrol 40 mg and lidocaine 1% 3 cc ? ?Anesthesia was provided by ethyl chloride ? ?The injection was performed in the RIGHT  posterior subacromial space. After pinning the skin with alcohol and anesthetized the skin with ethyl chloride the subacromial space was injected using a 20-gauge needle. There were no complications ? ?Sterile dressing was applied. ?  ? ?Chief Complaint  ?Patient presents with  ? Shoulder Pain  ?  Right shoulder pain. ?  ? ? ? ? ?HPI ?[name] is a 71 y.o. female.  [name] started having pain in her right shoulder in February.  She reports no trauma.  She was treated with ibuprofen and physical therapy.  The therapist thought that she had significant issues that she should just be seen by orthopedist ? ?She complains of pain weakness popping and loss of motion ? ?Treatment:  ?Nsaids yes ?PT yes ?Injection no ? ? ?Review of Systems ?ROS review of systems was negative except for the joint pain and she does have migraines ? ?Past Medical History:  ?Diagnosis Date  ? Hypertension   ? Migraine   ? ? ?Past Surgical History:  ?Procedure Laterality Date  ? HERNIA REPAIR    ? knee athroscopy both knees    ? ORIF ANKLE FRACTURE Left 04/11/2015  ? Procedure: OPEN REDUCTION INTERNAL FIXATION LEFT ANKLE;  Surgeon: Carole Civil, MD;  Location: [facility];  Service: Orthopedics;  Laterality:  Left;  ? TOTAL ABDOMINAL HYSTERECTOMY    ? total athroplasty rt knee  2009  ? ? ?No family history on file. ? ? ?Social History  ? ?Tobacco Use  ? Smoking status: Never  ? Smokeless tobacco: Never  ?Substance Use Topics  ? Alcohol use: No  ? Drug use: No  ? ? ?Allergies  ?Allergen Reactions  ? Erythromycin Nausea And Vomiting  ? ? ?@ALL @ ? ?No outpatient medications have been marked as taking for the 02/09/22 encounter (Office Visit) with Carole Civil, MD.  ? ? ? ? ? ?Physical Exam ?BP 114/68   Pulse 70   Ht 5\' 3"  (1.6 m)   Wt 180 lb (81.6 kg)   BMI 31.89 kg/m?  ? ?Ambulatory status normal with no assistive devices ? ?GENERAL : APPEARANCE IS NORMAL GROOMING IS GOOD ? ?NORMAL MOOD AND AFFECT ? ?AWAKE ALERT AND ORIENTED X 3  ? ?Right SHOULDER  ?TENDERNESS mild anterolateral acromial area ?ROM passive range of motion normal with pain after 100 degrees of flexion active range of motion is severely limited with abduction less than 90 and flexion less than 90 ?STABLE ANTERIORLY POSTERIORLY AND INFERIORLY ?SKIN CLEAN  ? ? ? ?PROVOCATIVE TESTS  ?DROP ARM TEST normal ?PAINFUL ARC 93 150 passive range of motion ?EMPTY CAN -JOBST TEST positive weakness grade 4 rotator cuff strength ?EXTERNAL ROTATION LAG TEST normal ?LIFT OFF TEST normal ?  BELLYPRESS TEST normal ? ?ON THE OTHER SIDE THE LEFT SHOULDER HAS NORMAL SKIN, NO ROM DEFICITS, EXCELLENT STABILITY, AND NORMAL 5/5 MMT STRENGTH  ? ?MEDICAL DECISION MAKING ? ?A.  ?Encounter Diagnoses  ?Name Primary?  ? Acute pain of right shoulder Yes  ? Nontraumatic tear of right rotator cuff, unspecified tear extent   ? Arthritis, shoulder region   ? ? ?B. DATA ANALYSED: ? ? ? ?IMAGING: ?Independent interpretation of images: X-ray ? ?Right shoulder 3 views ?  ?X-rays show arthritis of the glenohumeral joint ?  ?Right shoulder pain since February ?  ?No trauma ?  ?X-rays showed mild inferior osteophyte glenoid and humerus narrowing of the acromiohumeral distance but there does not  appear to be major proximal humeral migration ?  ?Axillary view shows no posterior glenoid erosion ?  ?Impression moderate arthritis glenohumeral joint with preservation of the joint space ? ?Orders: MRI right shoulder ? ?Outside records reviewed: None ? ?C. MANAGEMENT as above ? ?No orders of the defined types were placed in this encounter. ?  ?

## 2022-02-09 NOTE — Patient Instructions (Addendum)
°  You have received an injection of steroids into the joint. 15% of patients will have increased pain within the 24 hours postinjection.  ° °This is transient and will go away.  ° °We recommend that you use ice packs on the injection site for 20 minutes every 2 hours and extra strength Tylenol 2 tablets every 8 as needed until the pain resolves. ° °If you continue to have pain after taking the Tylenol and using the ice please call the office for further instructions. ° °While we are working on your approval for MRI please go ahead and call to schedule your appointment with [facility] within at least one (1) week.  ° °Central Scheduling °[phone]  °

## 2022-02-24 ENCOUNTER — Ambulatory Visit (HOSPITAL_COMMUNITY)
Admission: RE | Admit: 2022-02-24 | Discharge: 2022-02-24 | Disposition: A | Discharge status: Home / Self Care | Payer: Medicare Other | Source: Ambulatory Visit | Attending: Orthopedic Surgery | Admitting: Orthopedic Surgery

## 2022-02-24 DIAGNOSIS — M75101 Unspecified rotator cuff tear or rupture of right shoulder, not specified as traumatic: Secondary | ICD-10-CM | POA: Diagnosis present

## 2022-02-24 DIAGNOSIS — M25511 Pain in right shoulder: Secondary | ICD-10-CM | POA: Insufficient documentation

## 2022-02-28 ENCOUNTER — Ambulatory Visit (INDEPENDENT_AMBULATORY_CARE_PROVIDER_SITE_OTHER): Payer: Medicare Other | Admitting: Orthopedic Surgery

## 2022-02-28 DIAGNOSIS — M75101 Unspecified rotator cuff tear or rupture of right shoulder, not specified as traumatic: Secondary | ICD-10-CM

## 2022-02-28 NOTE — Progress Notes (Signed)
FOLLOW UP  ? ?No diagnosis found. ? ? ?Chief Complaint  ?Patient presents with  ? Follow-up  ?  Recheck on right shoulder  ? ? ? ?[name] had her MRI my interpretation is as follows, surprisingly she had a complete tear of the rotator cuff it was a massive tear with atrophy of the supraspinatus and infraspinatus she had partial tearing of the subscapularis as well.  She had glenohumeral arthritis and proximal migration of the humeral head ? ?Report is issued in the chart ? ?I discussed this with her fortunately she has full abduction and full flexion of the shoulder although her arm is weak ? ?Her pain was significantly improved with injection in the subacromial space. ? ?She has already completed 4 weeks of therapy ? ?I told her to go ahead and do and an additional 4 weeks and then we can revisit the situation ? ?There is no way the surgery will improve her range of motion because it is full she may get improved strength but decreased range of motion with surgery ? ?Right now I think we can manage this nonoperatively. ?We briefly discussed that she would need a replacement but did not go into the details of the reverse shoulder replacement that would be needed to fix this problem ? ? ? ?

## 2022-03-28 ENCOUNTER — Ambulatory Visit (INDEPENDENT_AMBULATORY_CARE_PROVIDER_SITE_OTHER): Payer: Medicare Other | Admitting: Orthopedic Surgery

## 2022-03-28 DIAGNOSIS — M75101 Unspecified rotator cuff tear or rupture of right shoulder, not specified as traumatic: Secondary | ICD-10-CM

## 2022-03-28 NOTE — Patient Instructions (Signed)
Follow up in September.

## 2022-03-28 NOTE — Progress Notes (Signed)
Chief Complaint  ?Patient presents with  ? Shoulder Pain  ?  Right, about the same  ? ?Follow-up patient reports improvement after her injection she has excellent range of motion just weakness when reaching above her head or reaching into a cabinet she also has weakness reaching out away from her from her body with something in her hand ? ?She does still have some pain and discomfort depending on activity ? ?We discussed treatment options and I recommended no surgery with that type of motion and pain less than a 5 out of 10 she does have the weakness but we are working on that in therapy she will see me in September or sooner if she needs a repeat injection ?

## 2022-06-20 ENCOUNTER — Telehealth: Payer: Self-pay

## 2022-06-20 MED ORDER — CEPHALEXIN 500 MG PO CAPS: 2000.0000 mg | ORAL_CAPSULE | Freq: Once | ORAL | 4 refills | Status: DC

## 2022-06-20 NOTE — Telephone Encounter (Signed)
Patient called asking for a refill on medication she needs to take before having dental work done. States she has 3 more appointment after this one, so she is asking if she can go ahead and get  enough for those visits also.  [facility]

## 2022-07-28 ENCOUNTER — Encounter: Payer: Self-pay | Admitting: Orthopedic Surgery

## 2022-07-28 ENCOUNTER — Ambulatory Visit (INDEPENDENT_AMBULATORY_CARE_PROVIDER_SITE_OTHER): Payer: Medicare Other | Admitting: Orthopedic Surgery

## 2022-07-28 DIAGNOSIS — M75101 Unspecified rotator cuff tear or rupture of right shoulder, not specified as traumatic: Secondary | ICD-10-CM

## 2022-07-28 DIAGNOSIS — F32A Depression, unspecified: Secondary | ICD-10-CM | POA: Insufficient documentation

## 2022-07-28 DIAGNOSIS — E559 Vitamin D deficiency, unspecified: Secondary | ICD-10-CM | POA: Insufficient documentation

## 2022-07-28 DIAGNOSIS — E7800 Pure hypercholesterolemia, unspecified: Secondary | ICD-10-CM | POA: Insufficient documentation

## 2022-07-28 DIAGNOSIS — Z6834 Body mass index (BMI) 34.0-34.9, adult: Secondary | ICD-10-CM | POA: Insufficient documentation

## 2022-07-28 DIAGNOSIS — I10 Essential (primary) hypertension: Secondary | ICD-10-CM | POA: Insufficient documentation

## 2022-07-28 NOTE — Progress Notes (Signed)
Chief Complaint  Patient presents with   Shoulder Pain    RT shoulder//follow up//pt states pain is better than it was at her initial visit but feels as if injection is wearing off   71 year old female with end-stage arthritis of the right shoulder but still has full 180 degrees of elevation mild discomfort she wants to play golf which is okay  She does have some weakness when she brings the shoulder from flexion down to her side  She is doing well we will see her when her pain is 5 or higher for injection right now it is a 3 out of 10  Her chronic condition is now stable with low risk of any additional morbidity

## 2022-10-28 ENCOUNTER — Ambulatory Visit (INDEPENDENT_AMBULATORY_CARE_PROVIDER_SITE_OTHER): Payer: Medicare Other | Admitting: Orthopedic Surgery

## 2022-10-28 ENCOUNTER — Encounter: Payer: Self-pay | Admitting: Orthopedic Surgery

## 2022-10-28 DIAGNOSIS — M75101 Unspecified rotator cuff tear or rupture of right shoulder, not specified as traumatic: Secondary | ICD-10-CM

## 2022-10-28 DIAGNOSIS — M25511 Pain in right shoulder: Secondary | ICD-10-CM | POA: Diagnosis not present

## 2022-10-28 DIAGNOSIS — M19019 Primary osteoarthritis, unspecified shoulder: Secondary | ICD-10-CM

## 2022-10-28 DIAGNOSIS — M19011 Primary osteoarthritis, right shoulder: Secondary | ICD-10-CM | POA: Diagnosis not present

## 2022-10-28 DIAGNOSIS — G8929 Other chronic pain: Secondary | ICD-10-CM | POA: Diagnosis not present

## 2022-10-28 MED ORDER — METHYLPREDNISOLONE ACETATE 40 MG/ML IJ SUSP
40.0000 mg | Freq: Once | INTRAMUSCULAR | Status: AC
  Administered 2022-10-28: 40 mg via INTRA_ARTICULAR

## 2022-10-28 NOTE — Progress Notes (Signed)
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  The injection was performed at the right subacromial space  Medication Depomedrol 40 mg and 1% lidocaine plain   There were no complications  The patient was observed for any reactions there were none and the patient was discharged.

## 2023-05-22 ENCOUNTER — Other Ambulatory Visit: Payer: Self-pay | Admitting: Orthopedic Surgery

## 2023-10-23 ENCOUNTER — Other Ambulatory Visit: Payer: Self-pay | Admitting: *Deleted

## 2023-10-23 DIAGNOSIS — Z1211 Encounter for screening for malignant neoplasm of colon: Secondary | ICD-10-CM

## 2023-10-26 ENCOUNTER — Ambulatory Visit (INDEPENDENT_AMBULATORY_CARE_PROVIDER_SITE_OTHER): Payer: Medicare Other | Admitting: Surgery

## 2023-10-26 ENCOUNTER — Encounter: Payer: Self-pay | Admitting: Surgery

## 2023-10-26 VITALS — BP 106/69 | HR 68 | Temp 98.3°F | Resp 12 | Ht 63.0 in | Wt 183.0 lb

## 2023-10-26 DIAGNOSIS — Z1211 Encounter for screening for malignant neoplasm of colon: Secondary | ICD-10-CM | POA: Diagnosis not present

## 2023-10-26 MED ORDER — SUTAB 1479-225-188 MG PO TABS: ORAL_TABLET | ORAL | 0 refills | Status: DC

## 2023-10-27 NOTE — Progress Notes (Signed)
[facility] History and Physical  Reason for Referral: Colonoscopy Referring Physician: Dr. Mayford Knife  Chief Complaint   New Patient (Initial Visit)     [name] is a 72 y.o. female.  HPI: Patient presents to discuss screening colonoscopy.  Her last colonoscopy was 4 to 5 years ago, at which time she was noted to have 3 small benign polyps.  The colonoscopy was performed in eating, and she was recommended to have a repeat colonoscopy in 4 to 6 years.  Since that time, she has no abdominal pain, hematochezia, or melena.  She is tolerating a diet without nausea and vomiting, moving her bowels without issue.  She has a family history of a brother who had a neuroendocrine tumor of his colon and required surgery and chemotherapy.  She states that he was 73 when he was diagnosed with this, and they did not believe it was colorectal cancer.  She has no other family history of any kind of colorectal cancer.  Her past medical history significant for migraines, chronic kidney disease stage III, and hypertension.  She denies use of blood thinning medications.  Her surgical history is significant for hysterectomy, varicose vein surgery, carpal tunnel release, left ankle surgery, and bilateral knee replacement.  She denies use of tobacco products, alcohol, and illicit drugs.  Past Medical History:  Diagnosis Date   Hypertension    Migraine     Past Surgical History:  Procedure Laterality Date   HERNIA REPAIR     knee athroscopy both knees     ORIF ANKLE FRACTURE Left 04/11/2015   Procedure: OPEN REDUCTION INTERNAL FIXATION LEFT ANKLE;  Surgeon: Vickki Hearing, MD;  Location: [facility];  Service: Orthopedics;  Laterality: Left;   TOTAL ABDOMINAL HYSTERECTOMY     total athroplasty rt knee  2009    No family history on file.  Social History   Tobacco Use   Smoking status: Never   Smokeless tobacco: Never  Substance Use Topics   Alcohol use: No   Drug use: No     Medications: I have reviewed the patient's current medications. Allergies as of 10/26/2023       Reactions   Erythromycin Nausea And Vomiting        Medication List        Accurate as of October 26, 2023 11:59 PM. If you have any questions, ask your nurse or doctor.          STOP taking these medications    zolpidem 10 MG tablet Commonly known as: AMBIEN Stopped by: Tanette Chauca A Deni Lefever       TAKE these medications    butalbital-acetaminophen-caffeine 50-325-40 MG tablet Commonly known as: FIORICET Take 1-2 tablets by mouth every 8 (eight) hours as needed. For migraine pain relief   citalopram 20 MG tablet Commonly known as: CELEXA Take 20 mg by mouth daily.   ondansetron 4 MG disintegrating tablet Commonly known as: Zofran ODT 4mg  ODT q4 hours prn nausea/vomit   potassium chloride 10 MEQ CR capsule Commonly known as: MICRO-K Take 2 capsules by mouth 2 (two) times daily.   simvastatin 40 MG tablet Commonly known as: ZOCOR Take 40 mg by mouth at bedtime.   Sutab [phone] MG Tabs Generic drug: Sodium Sulfate-Mag Sulfate-KCl Use as directed on form received in office Started by: Ivy Meriwether A Keiara Sneeringer   traMADol 50 MG tablet Commonly known as: ULTRAM Take 1 tablet (50 mg total) by mouth every 6 (six) hours as needed.  triamterene-hydrochlorothiazide 37.5-25 MG capsule Commonly known as: DYAZIDE Take 2 capsules by mouth daily.         ROS:  Constitutional: negative for chills, fatigue, and fevers Eyes: negative for visual disturbance and pain Ears, nose, mouth, throat, and face: negative for ear drainage and sore throat Respiratory: negative for cough, wheezing, and shortness of breath Cardiovascular: negative for chest pain and palpitations Gastrointestinal: negative for abdominal pain, nausea, reflux symptoms, and vomiting Genitourinary:negative for dysuria and frequency Integument/breast: negative for dryness and  rash Hematologic/lymphatic: negative for bleeding and lymphadenopathy Musculoskeletal:positive for back pain and neck pain Neurological: negative for dizziness and tremors Endocrine: negative for temperature intolerance  Blood pressure 106/69, pulse 68, temperature 98.3 F (36.8 C), temperature source Oral, resp. rate 12, height 5\' 3"  (1.6 m), weight 183 lb (83 kg), SpO2 97%. Physical Exam Vitals reviewed.  Constitutional:      Appearance: Normal appearance.  HENT:     Head: Normocephalic and atraumatic.  Eyes:     Extraocular Movements: Extraocular movements intact.     Pupils: Pupils are equal, round, and reactive to light.  Cardiovascular:     Rate and Rhythm: Normal rate and regular rhythm.  Pulmonary:     Breath sounds: Normal breath sounds.  Abdominal:     Comments: Abdomen soft, nondistended, no percussion tenderness, nontender to palpation; no rigidity, guarding, rebound tenderness  Musculoskeletal:        General: Normal range of motion.     Cervical back: Normal range of motion.  Skin:    General: Skin is warm and dry.  Neurological:     General: No focal deficit present.     Mental Status: She is alert and oriented to person, place, and time.  Psychiatric:        Mood and Affect: Mood normal.        Behavior: Behavior normal.     Results: No results found for this or any previous visit (from the past 48 hours).  No results found.   Assessment & Plan:  [name] is a 72 y.o. female who presents to discuss colonoscopy  -We discussed methods for screening for colon cancer, and the role colonoscopy plays in screening for colon cancer -The risk and benefits of colonoscopy were discussed including but not limited to bleeding, infection, missed lesions, and perforation of the colon requiring surgery.  After careful consideration, [name] has decided to proceed with colonoscopy.  -Patient tentatively scheduled for colonoscopy on 1/14 -Prescription provided to  the patient for Sutab prep, as well as an instruction sheet on how to appropriately administer the prep. -Coupon also provided to the patient for Sutab prep -Information provided to the patient regarding colonoscopy and Sutab prep  All questions were answered to the satisfaction of the patient.  Theophilus Kinds, DO [facility] [address] [place], [place] [address] [phone] (office)

## 2023-11-27 NOTE — Anesthesia Preprocedure Evaluation (Addendum)
 Anesthesia Evaluation  Patient identified by MRN, date of birth, ID band Patient awake    Reviewed: Allergy & Precautions, H&P , NPO status , Patient's Chart, lab work & pertinent test results, reviewed documented beta blocker date and time   Airway Mallampati: II  TM Distance: >3 FB Neck ROM: full    Dental no notable dental hx. (+) Teeth Intact, Dental Advisory Given   Pulmonary neg pulmonary ROS   Pulmonary exam normal breath sounds clear to auscultation       Cardiovascular Exercise Tolerance: Good hypertension,  Rhythm:regular Rate:Normal     Neuro/Psych  Headaches  negative psych ROS   GI/Hepatic negative GI ROS, Neg liver ROS,,,  Endo/Other  negative endocrine ROS    Renal/GU negative Renal ROS  negative genitourinary   Musculoskeletal   Abdominal   Peds  Hematology negative hematology ROS (+)   Anesthesia Other Findings   Reproductive/Obstetrics negative OB ROS                             Anesthesia Physical Anesthesia Plan  ASA: 2  Anesthesia Plan: General   Post-op Pain Management: Minimal or no pain anticipated   Induction: Intravenous  PONV Risk Score and Plan: Propofol  infusion  Airway Management Planned: Natural Airway and Nasal Cannula  Additional Equipment: None  Intra-op Plan:   Post-operative Plan:   Informed Consent: I have reviewed the patients History and Physical, chart, labs and discussed the procedure including the risks, benefits and alternatives for the proposed anesthesia with the patient or authorized representative who has indicated his/her understanding and acceptance.     Dental Advisory Given  Plan Discussed with: CRNA  Anesthesia Plan Comments:        Anesthesia Quick Evaluation

## 2023-11-28 ENCOUNTER — Other Ambulatory Visit: Payer: Self-pay

## 2023-11-28 ENCOUNTER — Encounter (HOSPITAL_COMMUNITY): Payer: Self-pay | Admitting: Surgery

## 2023-11-28 ENCOUNTER — Encounter (HOSPITAL_COMMUNITY)
Admission: RE | Disposition: A | Discharge status: Home / Self Care | Payer: Self-pay | Source: Home / Self Care | Attending: Surgery

## 2023-11-28 ENCOUNTER — Ambulatory Visit (HOSPITAL_BASED_OUTPATIENT_CLINIC_OR_DEPARTMENT_OTHER): Payer: Medicare Other | Admitting: Anesthesiology

## 2023-11-28 ENCOUNTER — Ambulatory Visit (HOSPITAL_COMMUNITY): Payer: Self-pay | Admitting: Anesthesiology

## 2023-11-28 ENCOUNTER — Ambulatory Visit (HOSPITAL_COMMUNITY)
Admission: RE | Admit: 2023-11-28 | Discharge: 2023-11-28 | Disposition: A | Discharge status: Home / Self Care | Payer: Medicare Other | Attending: Surgery | Admitting: Surgery

## 2023-11-28 DIAGNOSIS — Z90710 Acquired absence of both cervix and uterus: Secondary | ICD-10-CM | POA: Insufficient documentation

## 2023-11-28 DIAGNOSIS — N183 Chronic kidney disease, stage 3 unspecified: Secondary | ICD-10-CM | POA: Diagnosis not present

## 2023-11-28 DIAGNOSIS — Z96653 Presence of artificial knee joint, bilateral: Secondary | ICD-10-CM | POA: Insufficient documentation

## 2023-11-28 DIAGNOSIS — I129 Hypertensive chronic kidney disease with stage 1 through stage 4 chronic kidney disease, or unspecified chronic kidney disease: Secondary | ICD-10-CM | POA: Diagnosis not present

## 2023-11-28 DIAGNOSIS — Z1211 Encounter for screening for malignant neoplasm of colon: Secondary | ICD-10-CM

## 2023-11-28 DIAGNOSIS — K649 Unspecified hemorrhoids: Secondary | ICD-10-CM

## 2023-11-28 HISTORY — PX: COLONOSCOPY WITH PROPOFOL: SHX5780

## 2023-11-28 SURGERY — COLONOSCOPY WITH PROPOFOL
Anesthesia: General

## 2023-11-28 MED ORDER — LACTATED RINGERS IV SOLN: INTRAVENOUS | Status: DC | PRN

## 2023-11-28 MED ORDER — ONDANSETRON HCL 4 MG/2ML IJ SOLN
INTRAMUSCULAR | Status: DC | PRN
  Administered 2023-11-28: 4 mg via INTRAVENOUS

## 2023-11-28 MED ORDER — CHLORHEXIDINE GLUCONATE CLOTH 2 % EX PADS: 6.0000 | MEDICATED_PAD | Freq: Once | CUTANEOUS | Status: DC

## 2023-11-28 MED ORDER — PROPOFOL 500 MG/50ML IV EMUL
INTRAVENOUS | Status: DC | PRN
  Administered 2023-11-28: 125 ug/kg/min via INTRAVENOUS
  Administered 2023-11-28: 100 mg via INTRAVENOUS
  Administered 2023-11-28: 150 ug/kg/min via INTRAVENOUS

## 2023-11-28 NOTE — Anesthesia Postprocedure Evaluation (Signed)
 Anesthesia Post Note  Patient: [name]  Procedure(s) Performed: COLONOSCOPY WITH PROPOFOL   Patient location during evaluation: PACU Anesthesia Type: General Level of consciousness: awake and alert Pain management: pain level controlled Vital Signs Assessment: post-procedure vital signs reviewed and stable Respiratory status: spontaneous breathing, nonlabored ventilation, respiratory function stable and patient connected to nasal cannula oxygen Cardiovascular status: blood pressure returned to baseline and stable Postop Assessment: no apparent nausea or vomiting Anesthetic complications: no   There were no known notable events for this encounter.   Last Vitals:  Vitals:   11/28/23 0741 11/28/23 0904  BP: (!) 123/59 109/61  Pulse: 77 78  Resp: 17 15  Temp: 37 C 36.8 C  SpO2: 97% 94%    Last Pain:  Vitals:   11/28/23 0904  TempSrc: Oral  PainSc: 0-No pain                 Sham Alviar L Kylene Zamarron

## 2023-11-28 NOTE — H&P (Signed)
 [facility] History and Physical   Reason for Referral: Colonoscopy Referring Physician: Dr. Trudy   Chief Complaint   New Patient (Initial Visit)        [name] is a 73 y.o. female.  HPI: Patient presents to discuss screening colonoscopy.  Her last colonoscopy was 4 to 5 years ago, at which time she was noted to have 3 small benign polyps.  The colonoscopy was performed in eating, and she was recommended to have a repeat colonoscopy in 4 to 6 years.  Since that time, she has no abdominal pain, hematochezia, or melena.  She is tolerating a diet without nausea and vomiting, moving her bowels without issue.  She has a family history of a brother who had a neuroendocrine tumor of his colon and required surgery and chemotherapy.  She states that he was 28 when he was diagnosed with this, and they did not believe it was colorectal cancer.  She has no other family history of any kind of colorectal cancer.  Her past medical history significant for migraines, chronic kidney disease stage III, and hypertension.  She denies use of blood thinning medications.  Her surgical history is significant for hysterectomy, varicose vein surgery, carpal tunnel release, left ankle surgery, and bilateral knee replacement.  She denies use of tobacco products, alcohol, and illicit drugs.       Past Medical History:  Diagnosis Date   Hypertension     Migraine                 Past Surgical History:  Procedure Laterality Date   HERNIA REPAIR       knee athroscopy both knees       ORIF ANKLE FRACTURE Left 04/11/2015    Procedure: OPEN REDUCTION INTERNAL FIXATION LEFT ANKLE;  Surgeon: Taft FORBES Minerva, MD;  Location: [facility];  Service: Orthopedics;  Laterality: Left;   TOTAL ABDOMINAL HYSTERECTOMY       total athroplasty rt knee   2009          No family history on file.       Social History  Social History        Tobacco Use   Smoking status: Never   Smokeless tobacco: Never   Substance Use Topics   Alcohol use: No   Drug use: No        Medications: I have reviewed the patient's current medications. Allergies as of 10/26/2023         Reactions    Erythromycin Nausea And Vomiting            Medication List           Accurate as of October 26, 2023 11:59 PM. If you have any questions, ask your nurse or doctor.              STOP taking these medications     zolpidem  10 MG tablet Commonly known as: AMBIEN  Stopped by: Marvon Shillingburg A Bon Dowis           TAKE these medications     butalbital -acetaminophen -caffeine  50-325-40 MG tablet Commonly known as: FIORICET Take 1-2 tablets by mouth every 8 (eight) hours as needed. For migraine pain relief    citalopram  20 MG tablet Commonly known as: CELEXA  Take 20 mg by mouth daily.    ondansetron  4 MG disintegrating tablet Commonly known as: Zofran  ODT 4mg  ODT q4 hours prn nausea/vomit    potassium chloride  10 MEQ CR capsule Commonly known as:  MICRO-K  Take 2 capsules by mouth 2 (two) times daily.    simvastatin  40 MG tablet Commonly known as: ZOCOR  Take 40 mg by mouth at bedtime.    Sutab  [id number] MG Tabs Generic drug: Sodium Sulfate-Mag Sulfate-KCl Use as directed on form received in office Started by: Tirzah Fross A Lesley Galentine    traMADol  50 MG tablet Commonly known as: ULTRAM  Take 1 tablet (50 mg total) by mouth every 6 (six) hours as needed.    triamterene -hydrochlorothiazide  37.5-25 MG capsule Commonly known as: DYAZIDE  Take 2 capsules by mouth daily.               ROS:  Constitutional: negative for chills, fatigue, and fevers Eyes: negative for visual disturbance and pain Ears, nose, mouth, throat, and face: negative for ear drainage and sore throat Respiratory: negative for cough, wheezing, and shortness of breath Cardiovascular: negative for chest pain and palpitations Gastrointestinal: negative for abdominal pain, nausea, reflux symptoms, and  vomiting Genitourinary:negative for dysuria and frequency Integument/breast: negative for dryness and rash Hematologic/lymphatic: negative for bleeding and lymphadenopathy Musculoskeletal:positive for back pain and neck pain Neurological: negative for dizziness and tremors Endocrine: negative for temperature intolerance   Blood pressure 106/69, pulse 68, temperature 98.3 F (36.8 C), temperature source Oral, resp. rate 12, height 5' 3 (1.6 m), weight 183 lb (83 kg), SpO2 97%. Physical Exam Vitals reviewed.  Constitutional:      Appearance: Normal appearance.  HENT:     Head: Normocephalic and atraumatic.  Eyes:     Extraocular Movements: Extraocular movements intact.     Pupils: Pupils are equal, round, and reactive to light.  Cardiovascular:     Rate and Rhythm: Normal rate and regular rhythm.  Pulmonary:     Breath sounds: Normal breath sounds.  Abdominal:     Comments: Abdomen soft, nondistended, no percussion tenderness, nontender to palpation; no rigidity, guarding, rebound tenderness  Musculoskeletal:        General: Normal range of motion.     Cervical back: Normal range of motion.  Skin:    General: Skin is warm and dry.  Neurological:     General: No focal deficit present.     Mental Status: She is alert and oriented to person, place, and time.  Psychiatric:        Mood and Affect: Mood normal.        Behavior: Behavior normal.        Results: Lab Results Last 48 Hours  No results found for this or any previous visit (from the past 48 hours).     Imaging Results (Last 48 hours)  No results found.       Assessment & Plan:  [name] is a 73 y.o. female who presents to discuss colonoscopy   -We discussed methods for screening for colon cancer, and the role colonoscopy plays in screening for colon cancer -The risk and benefits of colonoscopy were discussed including but not limited to bleeding, infection, missed lesions, and perforation of the colon  requiring surgery.  After careful consideration, Paetyn Pietrzak has decided to proceed with colonoscopy.  -Plan for colonoscopy today -Prescription provided to the patient for Sutab  prep, as well as an instruction sheet on how to appropriately administer the prep. -Coupon also provided to the patient for Sutab  prep -Information provided to the patient regarding colonoscopy and Sutab  prep   All questions were answered to the satisfaction of the patient.   Dorothyann Brittle, DO [facility] [address] [place]  [place], [place] [address] [phone] (office)

## 2023-11-28 NOTE — Transfer of Care (Signed)
 Immediate Anesthesia Transfer of Care Note  Patient: [name]  Procedure(s) Performed: COLONOSCOPY WITH PROPOFOL   Patient Location: Endoscopy Unit  Anesthesia Type:General  Level of Consciousness: awake  Airway & Oxygen Therapy: Patient Spontanous Breathing  Post-op Assessment: Report given to RN and Post -op Vital signs reviewed and stable  Post vital signs: Reviewed and stable  Last Vitals:  Vitals Value Taken Time  BP 109/61   Temp 36.8 C 11/28/23 0904  Pulse 78 11/28/23 0904  Resp 15 11/28/23 0904  SpO2 94 % 11/28/23 0904    Last Pain:  Vitals:   11/28/23 0904  TempSrc: Oral  PainSc:       Patients Stated Pain Goal: 6 (11/28/23 0741)  Complications: No notable events documented.

## 2023-11-28 NOTE — Op Note (Signed)
 [facility] Patient Name: [name] Procedure Date: 11/28/2023 8:12 AM MRN: [id number] Date of Birth: 1951/01/20 Attending MD: [name], ,  CSN: [id number] Age: 73 Admit Type: Outpatient Procedure:                Colonoscopy Indications:              Screening for colorectal malignant neoplasm Providers:                [name], [name] Tubb RN, RN,                            [name] Referring MD:              Medicines:                Monitored Anesthesia Care Complications:            No immediate complications. Estimated Blood Loss:     Estimated blood loss: none. Procedure:                Pre-Anesthesia Assessment:                           - Prior to the procedure, a History and Physical                            was performed, and patient medications, allergies                            and sensitivities were reviewed. The patient's                            tolerance of previous anesthesia was reviewed.                           - The risks and benefits of the procedure and the                            sedation options and risks were discussed with the                            patient. All questions were answered and informed                            consent was obtained.                           - Pre-procedure physical examination revealed no                            contraindications to sedation.                           - Monitored anesthesia care under the supervision                            of a CRNA was determined to be medically necessary  for this procedure based on review of the patient's                            medical history, medications, and prior anesthesia                            history.                           After obtaining informed consent, the colonoscope                            was passed under direct vision. Throughout the                             procedure, the patient's blood pressure, pulse, and                            oxygen saturations were monitored continuously. The                            [id number]-[id number] ([id number]) scope was introduced through                            the anus and advanced to the the cecum, identified                            by the appendiceal orifice, ileocecal valve and                            palpation. The colonoscopy was performed with                            moderate difficulty due to a redundant colon and                            significant looping. Successful completion of the                            procedure was aided by receiving assistance from                            additional staff. The patient tolerated the                            procedure. The quality of the bowel preparation was                            evaluated using the BBPS [facility] Bowel Preparation                            Scale) with scores of: Right Colon = 2 (minor  amount of residual staining, small fragments of                            stool and/or opaque liquid, but mucosa seen well),                            Transverse Colon = 3 (entire mucosa seen well with                            no residual staining, small fragments of stool or                            opaque liquid) and Left Colon = 3 (entire mucosa                            seen well with no residual staining, small                            fragments of stool or opaque liquid). The total                            BBPS score equals 8. The quality of the bowel                            preparation was good. The ileocecal valve, the                            appendiceal orifice and the rectum were                            photographed. Scope insertion time was 36 minutes.                            Scope withdrawal time was 6 minutes. The total                            duration of the procedure was 42  minutes. Scope In: 8:19:53 AM Scope Out: 9:02:05 AM Scope Withdrawal Time: 0 hours 6 minutes 44 seconds  Total Procedure Duration: 0 hours 42 minutes 12 seconds  Findings:      Hemorrhoids were found on perianal exam.      The exam was otherwise without abnormality on direct and retroflexion       views. Impression:               - Hemorrhoids found on perianal exam.                           - The examination was otherwise normal on direct                            and retroflexion views.                           - No  specimens collected.                           - Hemorrhoids. Moderate Sedation:      An independent trained observer was present and continuously monitored       the patient. Recommendation:           - Discharge patient to home (ambulatory).                           - Resume previous diet.                           - Continue present medications.                           - Repeat colonoscopy PRN. Procedure Code(s):        --- Professional ---                           [id number], Colonoscopy, flexible; diagnostic, including                            collection of specimen(s) by brushing or washing,                            when performed (separate procedure) Diagnosis Code(s):        --- Professional ---                           Z12.11, Encounter for screening for malignant                            neoplasm of colon                           K64.9, Unspecified hemorrhoids CPT copyright 2022 American Medical Association. All rights reserved. The codes documented in this report are preliminary and upon coder review may  be revised to meet current compliance requirements. [name] A [name] Grassel Kelly,  11/28/2023 9:07:28 AM Number of Addenda: 0

## 2023-11-30 ENCOUNTER — Encounter (HOSPITAL_COMMUNITY): Payer: Self-pay | Admitting: Surgery

## 2024-01-29 ENCOUNTER — Ambulatory Visit: Payer: Medicare Other | Admitting: Diagnostic Neuroimaging

## 2024-11-18 ENCOUNTER — Encounter: Payer: Self-pay | Admitting: Orthopedic Surgery

## 2024-11-18 ENCOUNTER — Ambulatory Visit: Admitting: Orthopedic Surgery

## 2024-11-18 DIAGNOSIS — M75111 Incomplete rotator cuff tear or rupture of right shoulder, not specified as traumatic: Secondary | ICD-10-CM | POA: Diagnosis not present

## 2024-11-18 DIAGNOSIS — M19019 Primary osteoarthritis, unspecified shoulder: Secondary | ICD-10-CM

## 2024-11-18 DIAGNOSIS — M19011 Primary osteoarthritis, right shoulder: Secondary | ICD-10-CM

## 2024-11-18 DIAGNOSIS — M12811 Other specific arthropathies, not elsewhere classified, right shoulder: Secondary | ICD-10-CM

## 2024-11-18 MED ORDER — METHYLPREDNISOLONE ACETATE 40 MG/ML IJ SUSP
40.0000 mg | Freq: Once | INTRAMUSCULAR | Status: AC
  Administered 2024-11-18: 40 mg via INTRA_ARTICULAR

## 2024-11-18 NOTE — Progress Notes (Signed)
" ° °  Procedure note for injection   Chief Complaint  Patient presents with   Shoulder Pain    H/O RCT  HAD INJX 2 YRS AGO DID WEEL REQ ANOTHER      Encounter Diagnoses  Name Primary?   Nontraumatic incomplete tear of right rotator cuff Yes   Glenohumeral arthritis    Rotator cuff arthropathy of right shoulder         The patient has consented for injection of the SAS RT Joint: shoulder  Medication: Depo-Medrol  40 mg and lidocaine  1%  Time out completed: Yes  The site of injection was cleaned with alcohol and ethyl chloride.  The injection was given without any complications appropriate precautions were given.  RETURN 1 week for LLF INJX TRF   "

## 2024-11-25 ENCOUNTER — Ambulatory Visit (INDEPENDENT_AMBULATORY_CARE_PROVIDER_SITE_OTHER): Admitting: Orthopedic Surgery

## 2024-11-25 DIAGNOSIS — M65332 Trigger finger, left middle finger: Secondary | ICD-10-CM

## 2024-11-25 MED ORDER — METHYLPREDNISOLONE ACETATE 40 MG/ML IJ SUSP
40.0000 mg | Freq: Once | INTRAMUSCULAR | Status: AC
  Administered 2024-11-25: 40 mg via INTRA_ARTICULAR

## 2024-11-25 NOTE — Progress Notes (Signed)
" ° °  Procedure note for injection   Chief Complaint  Patient presents with   Injections    Here for left middle finger injection      Encounter Diagnosis  Name Primary?   Trigger middle finger of left hand Yes        The patient has consented for injection of the left  Joint: middle finger  Medication: Depo-Medrol  40 mg and lidocaine  1%  Time out completed: Yes  The site of injection was cleaned with alcohol and ethyl chloride.  The injection was given without any complications appropriate precautions were given.  "
